# Patient Record
Sex: Male | Born: 1935 | Race: White | Hispanic: No | Marital: Married | Smoking: Former smoker
Health system: Southern US, Community
[De-identification: ages and names within clinical notes are randomized; demographics above are authoritative.]

## PROBLEM LIST (undated history)

## (undated) DIAGNOSIS — N400 Enlarged prostate without lower urinary tract symptoms: Secondary | ICD-10-CM

## (undated) DIAGNOSIS — I219 Acute myocardial infarction, unspecified: Secondary | ICD-10-CM

## (undated) DIAGNOSIS — C439 Malignant melanoma of skin, unspecified: Secondary | ICD-10-CM

## (undated) DIAGNOSIS — R7881 Bacteremia: Secondary | ICD-10-CM

## (undated) DIAGNOSIS — J189 Pneumonia, unspecified organism: Secondary | ICD-10-CM

## (undated) DIAGNOSIS — M5030 Other cervical disc degeneration, unspecified cervical region: Secondary | ICD-10-CM

## (undated) DIAGNOSIS — E559 Vitamin D deficiency, unspecified: Secondary | ICD-10-CM

## (undated) DIAGNOSIS — E538 Deficiency of other specified B group vitamins: Secondary | ICD-10-CM

## (undated) DIAGNOSIS — I2510 Atherosclerotic heart disease of native coronary artery without angina pectoris: Secondary | ICD-10-CM

## (undated) DIAGNOSIS — I10 Essential (primary) hypertension: Secondary | ICD-10-CM

## (undated) DIAGNOSIS — G4700 Insomnia, unspecified: Secondary | ICD-10-CM

## (undated) DIAGNOSIS — E119 Type 2 diabetes mellitus without complications: Secondary | ICD-10-CM

## (undated) HISTORY — PX: HEMORRHOID SURGERY: SHX153

## (undated) HISTORY — DX: Deficiency of other specified B group vitamins: E53.8

## (undated) HISTORY — PX: HERNIA REPAIR: SHX51

## (undated) HISTORY — DX: Vitamin D deficiency, unspecified: E55.9

## (undated) HISTORY — PX: ROTATOR CUFF REPAIR: SHX139

## (undated) HISTORY — PX: OTHER SURGICAL HISTORY: SHX169

## (undated) HISTORY — DX: Acute myocardial infarction, unspecified: I21.9

## (undated) HISTORY — DX: Benign prostatic hyperplasia without lower urinary tract symptoms: N40.0

## (undated) HISTORY — DX: Type 2 diabetes mellitus without complications: E11.9

## (undated) HISTORY — DX: Other cervical disc degeneration, unspecified cervical region: M50.30

## (undated) HISTORY — DX: Atherosclerotic heart disease of native coronary artery without angina pectoris: I25.10

## (undated) HISTORY — DX: Insomnia, unspecified: G47.00

---

## 1999-02-08 ENCOUNTER — Ambulatory Visit (HOSPITAL_COMMUNITY): Admission: RE | Admit: 1999-02-08 | Discharge: 1999-02-08 | Payer: Self-pay | Admitting: *Deleted

## 1999-02-08 ENCOUNTER — Encounter: Payer: Self-pay | Admitting: *Deleted

## 2001-03-29 ENCOUNTER — Emergency Department (HOSPITAL_COMMUNITY): Admission: EM | Admit: 2001-03-29 | Discharge: 2001-03-29 | Payer: Self-pay | Admitting: Emergency Medicine

## 2001-03-29 ENCOUNTER — Encounter: Payer: Self-pay | Admitting: Emergency Medicine

## 2011-04-22 DIAGNOSIS — L570 Actinic keratosis: Secondary | ICD-10-CM | POA: Diagnosis not present

## 2011-04-22 DIAGNOSIS — D492 Neoplasm of unspecified behavior of bone, soft tissue, and skin: Secondary | ICD-10-CM | POA: Diagnosis not present

## 2011-04-22 DIAGNOSIS — Z85828 Personal history of other malignant neoplasm of skin: Secondary | ICD-10-CM | POA: Diagnosis not present

## 2011-04-22 DIAGNOSIS — Z85820 Personal history of malignant melanoma of skin: Secondary | ICD-10-CM | POA: Diagnosis not present

## 2011-04-22 DIAGNOSIS — B372 Candidiasis of skin and nail: Secondary | ICD-10-CM | POA: Diagnosis not present

## 2011-06-02 DIAGNOSIS — C44621 Squamous cell carcinoma of skin of unspecified upper limb, including shoulder: Secondary | ICD-10-CM | POA: Diagnosis not present

## 2011-06-02 DIAGNOSIS — L570 Actinic keratosis: Secondary | ICD-10-CM | POA: Diagnosis not present

## 2011-07-03 DIAGNOSIS — E119 Type 2 diabetes mellitus without complications: Secondary | ICD-10-CM | POA: Diagnosis not present

## 2011-07-03 DIAGNOSIS — G4700 Insomnia, unspecified: Secondary | ICD-10-CM | POA: Diagnosis not present

## 2011-08-01 DIAGNOSIS — H35039 Hypertensive retinopathy, unspecified eye: Secondary | ICD-10-CM | POA: Diagnosis not present

## 2011-09-26 DIAGNOSIS — E119 Type 2 diabetes mellitus without complications: Secondary | ICD-10-CM | POA: Diagnosis not present

## 2011-09-26 DIAGNOSIS — IMO0002 Reserved for concepts with insufficient information to code with codable children: Secondary | ICD-10-CM | POA: Diagnosis not present

## 2011-09-26 DIAGNOSIS — X58XXXA Exposure to other specified factors, initial encounter: Secondary | ICD-10-CM | POA: Diagnosis not present

## 2011-09-26 DIAGNOSIS — K8020 Calculus of gallbladder without cholecystitis without obstruction: Secondary | ICD-10-CM | POA: Diagnosis not present

## 2011-09-26 DIAGNOSIS — N281 Cyst of kidney, acquired: Secondary | ICD-10-CM | POA: Diagnosis not present

## 2011-09-26 DIAGNOSIS — M25559 Pain in unspecified hip: Secondary | ICD-10-CM | POA: Diagnosis not present

## 2011-09-26 DIAGNOSIS — I10 Essential (primary) hypertension: Secondary | ICD-10-CM | POA: Diagnosis not present

## 2011-09-26 DIAGNOSIS — Q619 Cystic kidney disease, unspecified: Secondary | ICD-10-CM | POA: Diagnosis not present

## 2011-09-26 DIAGNOSIS — R109 Unspecified abdominal pain: Secondary | ICD-10-CM | POA: Diagnosis not present

## 2011-09-26 DIAGNOSIS — R918 Other nonspecific abnormal finding of lung field: Secondary | ICD-10-CM | POA: Diagnosis not present

## 2011-10-02 DIAGNOSIS — M1990 Unspecified osteoarthritis, unspecified site: Secondary | ICD-10-CM | POA: Diagnosis not present

## 2011-10-02 DIAGNOSIS — M25559 Pain in unspecified hip: Secondary | ICD-10-CM | POA: Diagnosis not present

## 2011-10-03 DIAGNOSIS — K5730 Diverticulosis of large intestine without perforation or abscess without bleeding: Secondary | ICD-10-CM | POA: Diagnosis not present

## 2011-10-03 DIAGNOSIS — Z043 Encounter for examination and observation following other accident: Secondary | ICD-10-CM | POA: Diagnosis not present

## 2011-10-03 DIAGNOSIS — S72009A Fracture of unspecified part of neck of unspecified femur, initial encounter for closed fracture: Secondary | ICD-10-CM | POA: Diagnosis not present

## 2011-10-03 DIAGNOSIS — M47817 Spondylosis without myelopathy or radiculopathy, lumbosacral region: Secondary | ICD-10-CM | POA: Diagnosis not present

## 2011-10-07 DIAGNOSIS — S7223XA Displaced subtrochanteric fracture of unspecified femur, initial encounter for closed fracture: Secondary | ICD-10-CM | POA: Diagnosis not present

## 2011-10-08 DIAGNOSIS — E11319 Type 2 diabetes mellitus with unspecified diabetic retinopathy without macular edema: Secondary | ICD-10-CM | POA: Diagnosis not present

## 2011-10-09 DIAGNOSIS — S7290XA Unspecified fracture of unspecified femur, initial encounter for closed fracture: Secondary | ICD-10-CM | POA: Diagnosis not present

## 2011-10-21 DIAGNOSIS — L570 Actinic keratosis: Secondary | ICD-10-CM | POA: Diagnosis not present

## 2011-10-21 DIAGNOSIS — Z85820 Personal history of malignant melanoma of skin: Secondary | ICD-10-CM | POA: Diagnosis not present

## 2011-10-21 DIAGNOSIS — Z85828 Personal history of other malignant neoplasm of skin: Secondary | ICD-10-CM | POA: Diagnosis not present

## 2011-10-24 DIAGNOSIS — S7290XA Unspecified fracture of unspecified femur, initial encounter for closed fracture: Secondary | ICD-10-CM | POA: Diagnosis not present

## 2011-12-05 DIAGNOSIS — I10 Essential (primary) hypertension: Secondary | ICD-10-CM | POA: Diagnosis not present

## 2011-12-05 DIAGNOSIS — G609 Hereditary and idiopathic neuropathy, unspecified: Secondary | ICD-10-CM | POA: Diagnosis not present

## 2011-12-05 DIAGNOSIS — E119 Type 2 diabetes mellitus without complications: Secondary | ICD-10-CM | POA: Diagnosis not present

## 2011-12-05 DIAGNOSIS — E538 Deficiency of other specified B group vitamins: Secondary | ICD-10-CM | POA: Diagnosis not present

## 2011-12-05 DIAGNOSIS — E785 Hyperlipidemia, unspecified: Secondary | ICD-10-CM | POA: Diagnosis not present

## 2011-12-17 DIAGNOSIS — N138 Other obstructive and reflux uropathy: Secondary | ICD-10-CM | POA: Diagnosis not present

## 2011-12-17 DIAGNOSIS — Z0000 Encounter for general adult medical examination without abnormal findings: Secondary | ICD-10-CM | POA: Diagnosis not present

## 2011-12-17 DIAGNOSIS — Z125 Encounter for screening for malignant neoplasm of prostate: Secondary | ICD-10-CM | POA: Diagnosis not present

## 2011-12-31 DIAGNOSIS — K625 Hemorrhage of anus and rectum: Secondary | ICD-10-CM | POA: Diagnosis not present

## 2012-01-14 DIAGNOSIS — K625 Hemorrhage of anus and rectum: Secondary | ICD-10-CM | POA: Diagnosis not present

## 2012-01-14 DIAGNOSIS — K5730 Diverticulosis of large intestine without perforation or abscess without bleeding: Secondary | ICD-10-CM | POA: Diagnosis not present

## 2012-01-14 DIAGNOSIS — K649 Unspecified hemorrhoids: Secondary | ICD-10-CM | POA: Diagnosis not present

## 2012-01-14 DIAGNOSIS — Z1211 Encounter for screening for malignant neoplasm of colon: Secondary | ICD-10-CM | POA: Diagnosis not present

## 2012-01-16 DIAGNOSIS — K648 Other hemorrhoids: Secondary | ICD-10-CM | POA: Diagnosis not present

## 2012-01-16 DIAGNOSIS — Z1211 Encounter for screening for malignant neoplasm of colon: Secondary | ICD-10-CM | POA: Diagnosis not present

## 2012-01-16 DIAGNOSIS — K5730 Diverticulosis of large intestine without perforation or abscess without bleeding: Secondary | ICD-10-CM | POA: Diagnosis not present

## 2012-01-16 DIAGNOSIS — Z800 Family history of malignant neoplasm of digestive organs: Secondary | ICD-10-CM | POA: Diagnosis not present

## 2012-01-23 DIAGNOSIS — Z23 Encounter for immunization: Secondary | ICD-10-CM | POA: Diagnosis not present

## 2012-03-29 DIAGNOSIS — D235 Other benign neoplasm of skin of trunk: Secondary | ICD-10-CM | POA: Diagnosis not present

## 2012-03-29 DIAGNOSIS — Z85828 Personal history of other malignant neoplasm of skin: Secondary | ICD-10-CM | POA: Diagnosis not present

## 2012-03-29 DIAGNOSIS — Z85820 Personal history of malignant melanoma of skin: Secondary | ICD-10-CM | POA: Diagnosis not present

## 2012-03-29 DIAGNOSIS — L570 Actinic keratosis: Secondary | ICD-10-CM | POA: Diagnosis not present

## 2012-03-29 DIAGNOSIS — L820 Inflamed seborrheic keratosis: Secondary | ICD-10-CM | POA: Diagnosis not present

## 2012-05-12 ENCOUNTER — Emergency Department (HOSPITAL_COMMUNITY)
Admission: EM | Admit: 2012-05-12 | Discharge: 2012-05-12 | Disposition: A | Discharge status: Home / Self Care | Payer: Medicare Other | Attending: Emergency Medicine | Admitting: Emergency Medicine

## 2012-05-12 ENCOUNTER — Encounter (HOSPITAL_COMMUNITY): Payer: Self-pay | Admitting: *Deleted

## 2012-05-12 ENCOUNTER — Emergency Department (HOSPITAL_COMMUNITY): Payer: Medicare Other

## 2012-05-12 DIAGNOSIS — R059 Cough, unspecified: Secondary | ICD-10-CM | POA: Diagnosis not present

## 2012-05-12 DIAGNOSIS — M79603 Pain in arm, unspecified: Secondary | ICD-10-CM

## 2012-05-12 DIAGNOSIS — Z85820 Personal history of malignant melanoma of skin: Secondary | ICD-10-CM | POA: Insufficient documentation

## 2012-05-12 DIAGNOSIS — Z87891 Personal history of nicotine dependence: Secondary | ICD-10-CM | POA: Insufficient documentation

## 2012-05-12 DIAGNOSIS — Z79899 Other long term (current) drug therapy: Secondary | ICD-10-CM | POA: Insufficient documentation

## 2012-05-12 DIAGNOSIS — I10 Essential (primary) hypertension: Secondary | ICD-10-CM | POA: Diagnosis not present

## 2012-05-12 DIAGNOSIS — Z8701 Personal history of pneumonia (recurrent): Secondary | ICD-10-CM | POA: Insufficient documentation

## 2012-05-12 DIAGNOSIS — J438 Other emphysema: Secondary | ICD-10-CM | POA: Diagnosis not present

## 2012-05-12 DIAGNOSIS — R42 Dizziness and giddiness: Secondary | ICD-10-CM | POA: Insufficient documentation

## 2012-05-12 DIAGNOSIS — R05 Cough: Secondary | ICD-10-CM | POA: Insufficient documentation

## 2012-05-12 DIAGNOSIS — M79609 Pain in unspecified limb: Secondary | ICD-10-CM | POA: Insufficient documentation

## 2012-05-12 HISTORY — DX: Malignant melanoma of skin, unspecified: C43.9

## 2012-05-12 HISTORY — DX: Essential (primary) hypertension: I10

## 2012-05-12 HISTORY — DX: Pneumonia, unspecified organism: J18.9

## 2012-05-12 LAB — CBC WITH DIFFERENTIAL/PLATELET
Basophils Absolute: 0 10*3/uL (ref 0.0–0.1)
Basophils Relative: 1 % (ref 0–1)
Eosinophils Absolute: 0.1 10*3/uL (ref 0.0–0.7)
Eosinophils Relative: 2 % (ref 0–5)
HCT: 39.6 % (ref 39.0–52.0)
Hemoglobin: 13.5 g/dL (ref 13.0–17.0)
Lymphocytes Relative: 37 % (ref 12–46)
Lymphs Abs: 2.6 10*3/uL (ref 0.7–4.0)
MCH: 30.6 pg (ref 26.0–34.0)
MCHC: 34.1 g/dL (ref 30.0–36.0)
MCV: 89.8 fL (ref 78.0–100.0)
Monocytes Absolute: 0.7 10*3/uL (ref 0.1–1.0)
Monocytes Relative: 9 % (ref 3–12)
Neutro Abs: 3.6 10*3/uL (ref 1.7–7.7)
Neutrophils Relative %: 51 % (ref 43–77)
Platelets: 169 10*3/uL (ref 150–400)
RBC: 4.41 MIL/uL (ref 4.22–5.81)
RDW: 13.7 % (ref 11.5–15.5)
WBC: 7.1 10*3/uL (ref 4.0–10.5)

## 2012-05-12 LAB — POCT I-STAT TROPONIN I
Troponin i, poc: 0 ng/mL (ref 0.00–0.08)
Troponin i, poc: 0 ng/mL (ref 0.00–0.08)

## 2012-05-12 LAB — POCT I-STAT, CHEM 8
BUN: 20 mg/dL (ref 6–23)
Calcium, Ion: 1.18 mmol/L (ref 1.13–1.30)
Chloride: 104 mEq/L (ref 96–112)
Creatinine, Ser: 0.8 mg/dL (ref 0.50–1.35)
Glucose, Bld: 178 mg/dL — ABNORMAL HIGH (ref 70–99)
HCT: 41 % (ref 39.0–52.0)
Hemoglobin: 13.9 g/dL (ref 13.0–17.0)
Potassium: 4.7 mEq/L (ref 3.5–5.1)
Sodium: 140 mEq/L (ref 135–145)
TCO2: 29 mmol/L (ref 0–100)

## 2012-05-12 NOTE — ED Provider Notes (Signed)
History     CSN: [id number]  Arrival date & time 05/12/12  1351   First MD Initiated Contact with Patient 05/12/12 1426      Chief Complaint  Patient presents with  . Arm Pain    (Consider location/radiation/quality/duration/timing/severity/associated sxs/prior treatment) The history is provided by the patient.   patient presents with a left-sided arm pain. It began while he was eating. It was on the back the arm and was sharp. he is pain-free now. He states he stood up and was a little lightheaded. No chest pain. No fevers. No new cough. He had no nausea. No diaphoresis. He has no known heart issues. He states he has not seen a cardiologist. No weight loss. No hemoptysis. No swelling in his legs.  Past Medical History  Diagnosis Date  . Pneumonia   . Melanoma   . Hypertension     Past Surgical History  Procedure Laterality Date  . Hernia repair      L inguinal  . Rotator cuff repair      R  . Melanoma removal      History reviewed. No pertinent family history.  History  Substance Use Topics  . Smoking status: Former Games developer  . Smokeless tobacco: Not on file  . Alcohol Use: Not on file      Review of Systems  Constitutional: Negative for activity change and appetite change.  HENT: Negative for neck stiffness.   Eyes: Negative for pain.  Respiratory: Positive for cough. Negative for chest tightness and shortness of breath.   Cardiovascular: Negative for chest pain and leg swelling.  Gastrointestinal: Negative for nausea, vomiting, abdominal pain and diarrhea.  Genitourinary: Negative for flank pain.  Musculoskeletal: Negative for back pain.  Skin: Negative for rash.  Neurological: Negative for weakness, numbness and headaches.  Psychiatric/Behavioral: Negative for behavioral problems.    Allergies  Demerol  Home Medications   Current Outpatient Rx  Name  Route  Sig  Dispense  Refill  . cholecalciferol (VITAMIN D) 1000 UNITS tablet   Oral   Take 1,000  Units by mouth daily.         Marland Kitchen losartan (COZAAR) 50 MG tablet   Oral   Take 50 mg by mouth daily.         . Polyvinyl Alcohol-Povidone (REFRESH OP)   Both Eyes   Place 1 drop into both eyes daily.         . Saw Palmetto, Serenoa repens, (SAW PALMETTO PO)   Oral   Take 1 tablet by mouth daily.         . traZODone (DESYREL) 50 MG tablet   Oral   Take 50 mg by mouth at bedtime.           BP 167/90  Pulse 69  Temp(Src) 97.6 F (36.4 C) (Oral)  Resp 15  Ht 5' 11.25" (1.81 m)  Wt 170 lb (77.111 kg)  BMI 23.54 kg/m2  SpO2 98%  Physical Exam  Nursing note and vitals reviewed. Constitutional: He is oriented to person, place, and time. He appears well-developed and well-nourished.  HENT:  Head: Normocephalic and atraumatic.  Eyes: EOM are normal. Pupils are equal, round, and reactive to light.  Neck: Normal range of motion. Neck supple.  Cardiovascular: Normal rate, regular rhythm and normal heart sounds.   No murmur heard. Pulmonary/Chest: Effort normal and breath sounds normal.  Abdominal: Soft. Bowel sounds are normal. He exhibits no distension and no mass. There is no tenderness. There  is no rebound and no guarding.  Musculoskeletal: Normal range of motion. He exhibits no edema.  Neurological: He is alert and oriented to person, place, and time. No cranial nerve deficit.  Skin: Skin is warm and dry.  Psychiatric: He has a normal mood and affect.    ED Course  Procedures (including critical care time)  Labs Reviewed  POCT I-STAT, CHEM 8 - Abnormal; Notable for the following:    Glucose, Bld 178 (*)    All other components within normal limits  CBC WITH DIFFERENTIAL  POCT I-STAT TROPONIN I  POCT I-STAT TROPONIN I   Dg Chest 2 View  05/12/2012  *RADIOLOGY REPORT*  Clinical Data: Arm pain  CHEST - 2 VIEW  Comparison: Chest x-ray of 09/06/2010  Findings: The lungs are clear but hyperaerated consistent with emphysema.  No infiltrate or effusion is seen.   Mediastinal contours are stable.  The heart is within normal limits in size. There are degenerative changes diffusely throughout the thoracic spine.  IMPRESSION: Emphysema.  No active lung disease.   Original Report Authenticated By: Dwyane Dee, M.D.      1. Arm pain      Date: 05/12/2012  Rate: 84  Rhythm: normal sinus rhythm  QRS Axis: normal  Intervals: normal  ST/T Wave abnormalities: normal  Conduction Disutrbances:none  Narrative Interpretation:   Old EKG Reviewed: none available    MDM    patient was left arm pain. EKG and labwork reassuring. Doubt cardiac cause. Negative troponin x2.       Juliet Rude. Rubin Payor, MD 05/12/12 1752

## 2012-05-12 NOTE — ED Notes (Signed)
Pt was eating lunch when he began experiencing L shoulder pain that radiated down to his elbow accompanied by nausea.  Pt also noticed dizziness when he stood up. Denies sob.  Pt denies cardiac hx.

## 2012-05-28 DIAGNOSIS — E538 Deficiency of other specified B group vitamins: Secondary | ICD-10-CM | POA: Diagnosis not present

## 2012-05-28 DIAGNOSIS — Z79899 Other long term (current) drug therapy: Secondary | ICD-10-CM | POA: Diagnosis not present

## 2012-05-28 DIAGNOSIS — E119 Type 2 diabetes mellitus without complications: Secondary | ICD-10-CM | POA: Diagnosis not present

## 2012-06-04 DIAGNOSIS — E119 Type 2 diabetes mellitus without complications: Secondary | ICD-10-CM | POA: Diagnosis not present

## 2012-06-15 DIAGNOSIS — R42 Dizziness and giddiness: Secondary | ICD-10-CM | POA: Diagnosis not present

## 2012-06-24 DIAGNOSIS — R42 Dizziness and giddiness: Secondary | ICD-10-CM | POA: Diagnosis not present

## 2012-07-20 DIAGNOSIS — E538 Deficiency of other specified B group vitamins: Secondary | ICD-10-CM | POA: Diagnosis not present

## 2012-07-20 DIAGNOSIS — I10 Essential (primary) hypertension: Secondary | ICD-10-CM | POA: Diagnosis not present

## 2012-07-20 DIAGNOSIS — E119 Type 2 diabetes mellitus without complications: Secondary | ICD-10-CM | POA: Diagnosis not present

## 2012-07-20 DIAGNOSIS — N400 Enlarged prostate without lower urinary tract symptoms: Secondary | ICD-10-CM | POA: Diagnosis not present

## 2012-07-20 DIAGNOSIS — Z0000 Encounter for general adult medical examination without abnormal findings: Secondary | ICD-10-CM | POA: Diagnosis not present

## 2012-07-20 DIAGNOSIS — G479 Sleep disorder, unspecified: Secondary | ICD-10-CM | POA: Diagnosis not present

## 2012-07-20 DIAGNOSIS — E559 Vitamin D deficiency, unspecified: Secondary | ICD-10-CM | POA: Diagnosis not present

## 2012-07-20 DIAGNOSIS — M2550 Pain in unspecified joint: Secondary | ICD-10-CM | POA: Diagnosis not present

## 2012-07-20 DIAGNOSIS — M5090 Cervical disc disorder, unspecified, unspecified cervical region: Secondary | ICD-10-CM | POA: Diagnosis not present

## 2012-07-20 DIAGNOSIS — Z79899 Other long term (current) drug therapy: Secondary | ICD-10-CM | POA: Diagnosis not present

## 2012-08-09 DIAGNOSIS — R3915 Urgency of urination: Secondary | ICD-10-CM | POA: Diagnosis not present

## 2012-08-09 DIAGNOSIS — N400 Enlarged prostate without lower urinary tract symptoms: Secondary | ICD-10-CM | POA: Diagnosis not present

## 2012-08-31 DIAGNOSIS — M5090 Cervical disc disorder, unspecified, unspecified cervical region: Secondary | ICD-10-CM | POA: Diagnosis not present

## 2012-08-31 DIAGNOSIS — I10 Essential (primary) hypertension: Secondary | ICD-10-CM | POA: Diagnosis not present

## 2012-08-31 DIAGNOSIS — E559 Vitamin D deficiency, unspecified: Secondary | ICD-10-CM | POA: Diagnosis not present

## 2012-08-31 DIAGNOSIS — N400 Enlarged prostate without lower urinary tract symptoms: Secondary | ICD-10-CM | POA: Diagnosis not present

## 2012-08-31 DIAGNOSIS — E119 Type 2 diabetes mellitus without complications: Secondary | ICD-10-CM | POA: Diagnosis not present

## 2012-08-31 DIAGNOSIS — E538 Deficiency of other specified B group vitamins: Secondary | ICD-10-CM | POA: Diagnosis not present

## 2012-08-31 DIAGNOSIS — M2550 Pain in unspecified joint: Secondary | ICD-10-CM | POA: Diagnosis not present

## 2012-08-31 DIAGNOSIS — G479 Sleep disorder, unspecified: Secondary | ICD-10-CM | POA: Diagnosis not present

## 2012-10-06 DIAGNOSIS — E119 Type 2 diabetes mellitus without complications: Secondary | ICD-10-CM | POA: Diagnosis not present

## 2012-10-29 ENCOUNTER — Emergency Department (HOSPITAL_COMMUNITY): Payer: Medicare Other

## 2012-10-29 ENCOUNTER — Emergency Department (HOSPITAL_COMMUNITY)
Admission: EM | Admit: 2012-10-29 | Discharge: 2012-10-29 | Disposition: A | Discharge status: Home / Self Care | Payer: Medicare Other | Attending: Emergency Medicine | Admitting: Emergency Medicine

## 2012-10-29 ENCOUNTER — Encounter (HOSPITAL_COMMUNITY): Payer: Self-pay

## 2012-10-29 DIAGNOSIS — Z79899 Other long term (current) drug therapy: Secondary | ICD-10-CM | POA: Insufficient documentation

## 2012-10-29 DIAGNOSIS — M542 Cervicalgia: Secondary | ICD-10-CM | POA: Diagnosis not present

## 2012-10-29 DIAGNOSIS — Z8701 Personal history of pneumonia (recurrent): Secondary | ICD-10-CM | POA: Diagnosis not present

## 2012-10-29 DIAGNOSIS — I10 Essential (primary) hypertension: Secondary | ICD-10-CM | POA: Insufficient documentation

## 2012-10-29 DIAGNOSIS — Y92009 Unspecified place in unspecified non-institutional (private) residence as the place of occurrence of the external cause: Secondary | ICD-10-CM | POA: Insufficient documentation

## 2012-10-29 DIAGNOSIS — Z85828 Personal history of other malignant neoplasm of skin: Secondary | ICD-10-CM | POA: Insufficient documentation

## 2012-10-29 DIAGNOSIS — S0093XA Contusion of unspecified part of head, initial encounter: Secondary | ICD-10-CM

## 2012-10-29 DIAGNOSIS — W19XXXA Unspecified fall, initial encounter: Secondary | ICD-10-CM

## 2012-10-29 DIAGNOSIS — S0993XA Unspecified injury of face, initial encounter: Secondary | ICD-10-CM | POA: Diagnosis not present

## 2012-10-29 DIAGNOSIS — Z87891 Personal history of nicotine dependence: Secondary | ICD-10-CM | POA: Insufficient documentation

## 2012-10-29 DIAGNOSIS — S0003XA Contusion of scalp, initial encounter: Secondary | ICD-10-CM | POA: Insufficient documentation

## 2012-10-29 DIAGNOSIS — W010XXA Fall on same level from slipping, tripping and stumbling without subsequent striking against object, initial encounter: Secondary | ICD-10-CM | POA: Insufficient documentation

## 2012-10-29 DIAGNOSIS — Y9389 Activity, other specified: Secondary | ICD-10-CM | POA: Insufficient documentation

## 2012-10-29 DIAGNOSIS — S1093XA Contusion of unspecified part of neck, initial encounter: Secondary | ICD-10-CM | POA: Diagnosis not present

## 2012-10-29 NOTE — ED Provider Notes (Signed)
CSN: [id number]     Arrival date & time 10/29/12  1132 History     First MD Initiated Contact with Patient 10/29/12 1141     Chief Complaint  Patient presents with  . Fall   (Consider location/radiation/quality/duration/timing/severity/associated sxs/prior Treatment) HPI  Bronislaw Switzer is a 79 y.o. male with past medical history significant for hypertension, melanoma, not on any blood thinners coming in complaining of mechanical trip and fall when he stumbled over a step this a.m. Patient has full recollection of all events, he denies loss of consciousness, headache, cervicalgia, chest pain, abdominal pain, difficulty moving major joints, dysarthria ataxia, change in vision or unilateral weakness. He has a hematoma to the right forehead, but he states it is painless.  Past Medical History  Diagnosis Date  . Pneumonia   . Melanoma   . Hypertension    Past Surgical History  Procedure Laterality Date  . Hernia repair      L inguinal  . Rotator cuff repair      R  . Melanoma removal     No family history on file. History  Substance Use Topics  . Smoking status: Former Games developer  . Smokeless tobacco: Not on file  . Alcohol Use: Not on file    Review of Systems 10 systems reviewed and found to be negative, except as noted in the HPI  Allergies  Demerol  Home Medications   Current Outpatient Rx  Name  Route  Sig  Dispense  Refill  . cholecalciferol (VITAMIN D) 1000 UNITS tablet   Oral   Take 1,000 Units by mouth daily.         Marland Kitchen losartan (COZAAR) 50 MG tablet   Oral   Take 50 mg by mouth daily.         . Polyvinyl Alcohol-Povidone (REFRESH OP)   Both Eyes   Place 1 drop into both eyes daily.         . Saw Palmetto, Serenoa repens, (SAW PALMETTO PO)   Oral   Take 1 tablet by mouth daily.         . traZODone (DESYREL) 50 MG tablet   Oral   Take 50 mg by mouth at bedtime.          BP 139/79  Pulse 87  Temp(Src) 98 F (36.7 C) (Oral)  Resp 18  SpO2  99% Physical Exam  Nursing note and vitals reviewed. Constitutional: He is oriented to person, place, and time. He appears well-developed and well-nourished. No distress.  HENT:  Head: Normocephalic.  Mouth/Throat: Oropharynx is clear and moist.  Large hematoma to right  forehead  Eyes: Conjunctivae and EOM are normal. Pupils are equal, round, and reactive to light.  Neck: Normal range of motion. Neck supple.  No midline tenderness to palpation or step-offs appreciated. Patient has full range of motion without pain.    Cardiovascular: Normal rate.   Pulmonary/Chest: Effort normal and breath sounds normal. No stridor. No respiratory distress. He has no wheezes. He has no rales. He exhibits no tenderness.  Abdominal: Soft. Bowel sounds are normal. He exhibits no distension and no mass. There is no tenderness. There is no rebound.  Musculoskeletal: Normal range of motion.  Neurological: He is alert and oriented to person, place, and time.  Psychiatric: He has a normal mood and affect.    ED Course   Procedures (including critical care time)  Labs Reviewed - No data to display Dg Cervical Spine Complete  10/29/2012   *  RADIOLOGY REPORT*  Clinical Data: Larey Seat and hit head.  Neck pain.  CERVICAL SPINE - COMPLETE 4+ VIEW  Comparison: 07/20/2012 and 01/24/2011  Findings: No evidence for fracture.  Marked loss of intervertebral disc height is seen at C4-5, C5-6, and C6-7 with endplate degeneration associated.  There is some more modest loss of intervertebral disc height at C3-4.  Stable appearance of the C1-2 articulation.  Facets are well-aligned bilaterally with facet osteoarthritis noted bilaterally in the mid and lower cervical levels.  Reversal of normal cervical lordosis is stable.  No prevertebral soft tissue swelling.  IMPRESSION: Stable exam.  Diffuse degenerative changes as before without new or acute interval findings.   Original Report Authenticated By: Kennith Center, M.D.   Ct Head Wo  Contrast  10/29/2012   *RADIOLOGY REPORT*  Clinical Data: Recent traumatic injury with right-sided hematoma  CT HEAD WITHOUT CONTRAST  Technique:  Contiguous axial images were obtained from the base of the skull through the vertex without contrast.  Comparison: None.  Findings: Small scalp hematoma is noted in the right frontal region.  No associated bony abnormality is seen.  The ventricles are mildly prominent as are the cortical sulci consistent with diffuse cortical atrophy.  No findings to suggest acute hemorrhage, acute infarction or space-occupying mass lesion are noted.  IMPRESSION: Soft tissue abnormality without acute intracranial abnormality.   Original Report Authenticated By: Alcide Clever, M.D.   1. Fall at home, initial encounter   2. Head contusion, initial encounter     MDM   Filed Vitals:   10/29/12 1138 10/29/12 1200 10/29/12 1221  BP: 139/79 123/64 146/78  Pulse: 87 79 87  Temp: 98 F (36.7 C)  97.8 F (36.6 C)  TempSrc: Oral  Oral  Resp: 18  20  SpO2: 99% 97% 98%     Eron Staat is a 79 y.o. male mechanical trip and fall and right forehead hematoma, normal neurologic exam, C-spine and head CT showed no abnormalities on imaging. We have discussed head trauma return precautions. Patient refused pain medication while in the ED.  Pt is hemodynamically stable, appropriate for, and amenable to discharge at this time. Pt verbalized understanding and agrees with care plan. All questions answered. Outpatient follow-up and specific return precautions discussed.    Note: Portions of this report may have been transcribed using voice recognition software. Every effort was made to ensure accuracy; however, inadvertent computerized transcription errors may be present    Wynetta Emery, PA-C 10/29/12 1357

## 2012-10-29 NOTE — ED Notes (Signed)
Patient states he has chronic neck problems, but not having any pain at this time.   Patient states he was coming inside from walking dog and he tripped going in.

## 2012-10-29 NOTE — ED Provider Notes (Signed)
Medical screening examination/treatment/procedure(s) were performed by non-physician practitioner and as supervising physician I was immediately available for consultation/collaboration.  Loraine Bhullar, MD 10/29/12 1414 

## 2012-10-29 NOTE — ED Notes (Signed)
Pt. Larey Seat this am when stepping up .  Pt. Has a hematoma to his rt. Forehead.  Pt. Remembers the fall,. Denies any pain or dizziness.

## 2012-12-07 DIAGNOSIS — E1142 Type 2 diabetes mellitus with diabetic polyneuropathy: Secondary | ICD-10-CM | POA: Diagnosis not present

## 2012-12-07 DIAGNOSIS — L84 Corns and callosities: Secondary | ICD-10-CM | POA: Diagnosis not present

## 2012-12-07 DIAGNOSIS — E1149 Type 2 diabetes mellitus with other diabetic neurological complication: Secondary | ICD-10-CM | POA: Diagnosis not present

## 2012-12-27 DIAGNOSIS — E119 Type 2 diabetes mellitus without complications: Secondary | ICD-10-CM | POA: Diagnosis not present

## 2012-12-27 DIAGNOSIS — E559 Vitamin D deficiency, unspecified: Secondary | ICD-10-CM | POA: Diagnosis not present

## 2012-12-27 DIAGNOSIS — E538 Deficiency of other specified B group vitamins: Secondary | ICD-10-CM | POA: Diagnosis not present

## 2012-12-27 DIAGNOSIS — G479 Sleep disorder, unspecified: Secondary | ICD-10-CM | POA: Diagnosis not present

## 2012-12-27 DIAGNOSIS — L84 Corns and callosities: Secondary | ICD-10-CM | POA: Diagnosis not present

## 2012-12-27 DIAGNOSIS — N400 Enlarged prostate without lower urinary tract symptoms: Secondary | ICD-10-CM | POA: Diagnosis not present

## 2012-12-27 DIAGNOSIS — I10 Essential (primary) hypertension: Secondary | ICD-10-CM | POA: Diagnosis not present

## 2012-12-27 DIAGNOSIS — M5090 Cervical disc disorder, unspecified, unspecified cervical region: Secondary | ICD-10-CM | POA: Diagnosis not present

## 2013-02-22 DIAGNOSIS — L84 Corns and callosities: Secondary | ICD-10-CM | POA: Diagnosis not present

## 2013-02-22 DIAGNOSIS — E538 Deficiency of other specified B group vitamins: Secondary | ICD-10-CM | POA: Diagnosis not present

## 2013-02-22 DIAGNOSIS — N400 Enlarged prostate without lower urinary tract symptoms: Secondary | ICD-10-CM | POA: Diagnosis not present

## 2013-02-22 DIAGNOSIS — Z23 Encounter for immunization: Secondary | ICD-10-CM | POA: Diagnosis not present

## 2013-02-22 DIAGNOSIS — E119 Type 2 diabetes mellitus without complications: Secondary | ICD-10-CM | POA: Diagnosis not present

## 2013-02-22 DIAGNOSIS — Z0000 Encounter for general adult medical examination without abnormal findings: Secondary | ICD-10-CM | POA: Diagnosis not present

## 2013-02-22 DIAGNOSIS — G479 Sleep disorder, unspecified: Secondary | ICD-10-CM | POA: Diagnosis not present

## 2013-02-22 DIAGNOSIS — E559 Vitamin D deficiency, unspecified: Secondary | ICD-10-CM | POA: Diagnosis not present

## 2013-02-22 DIAGNOSIS — I10 Essential (primary) hypertension: Secondary | ICD-10-CM | POA: Diagnosis not present

## 2013-02-22 DIAGNOSIS — I059 Rheumatic mitral valve disease, unspecified: Secondary | ICD-10-CM | POA: Diagnosis not present

## 2013-02-22 DIAGNOSIS — M2550 Pain in unspecified joint: Secondary | ICD-10-CM | POA: Diagnosis not present

## 2013-07-19 ENCOUNTER — Ambulatory Visit
Admission: RE | Admit: 2013-07-19 | Discharge: 2013-07-19 | Disposition: A | Discharge status: Home / Self Care | Payer: Medicare Other | Source: Ambulatory Visit | Attending: Internal Medicine | Admitting: Internal Medicine

## 2013-07-19 ENCOUNTER — Other Ambulatory Visit: Payer: Self-pay | Admitting: Internal Medicine

## 2013-07-19 DIAGNOSIS — R059 Cough, unspecified: Secondary | ICD-10-CM | POA: Diagnosis not present

## 2013-07-19 DIAGNOSIS — R0789 Other chest pain: Secondary | ICD-10-CM | POA: Diagnosis not present

## 2013-07-19 DIAGNOSIS — R000 Tachycardia, unspecified: Secondary | ICD-10-CM | POA: Diagnosis not present

## 2013-07-19 DIAGNOSIS — R05 Cough: Secondary | ICD-10-CM

## 2013-07-19 DIAGNOSIS — K3189 Other diseases of stomach and duodenum: Secondary | ICD-10-CM | POA: Diagnosis not present

## 2013-07-19 DIAGNOSIS — R1013 Epigastric pain: Secondary | ICD-10-CM | POA: Diagnosis not present

## 2013-07-19 DIAGNOSIS — R079 Chest pain, unspecified: Secondary | ICD-10-CM | POA: Diagnosis not present

## 2013-07-21 ENCOUNTER — Inpatient Hospital Stay (HOSPITAL_COMMUNITY): Admission: RE | Admit: 2013-07-21 | Payer: Medicare Other | Source: Ambulatory Visit

## 2013-08-02 DIAGNOSIS — K3189 Other diseases of stomach and duodenum: Secondary | ICD-10-CM | POA: Diagnosis not present

## 2013-08-02 DIAGNOSIS — R000 Tachycardia, unspecified: Secondary | ICD-10-CM | POA: Diagnosis not present

## 2013-08-02 DIAGNOSIS — R079 Chest pain, unspecified: Secondary | ICD-10-CM | POA: Diagnosis not present

## 2013-08-02 DIAGNOSIS — I059 Rheumatic mitral valve disease, unspecified: Secondary | ICD-10-CM | POA: Diagnosis not present

## 2013-08-02 DIAGNOSIS — R05 Cough: Secondary | ICD-10-CM | POA: Diagnosis not present

## 2013-08-02 DIAGNOSIS — R059 Cough, unspecified: Secondary | ICD-10-CM | POA: Diagnosis not present

## 2013-08-02 DIAGNOSIS — I10 Essential (primary) hypertension: Secondary | ICD-10-CM | POA: Diagnosis not present

## 2013-08-02 DIAGNOSIS — R1013 Epigastric pain: Secondary | ICD-10-CM | POA: Diagnosis not present

## 2013-08-05 ENCOUNTER — Telehealth (HOSPITAL_COMMUNITY): Payer: Self-pay

## 2013-08-11 ENCOUNTER — Ambulatory Visit (HOSPITAL_COMMUNITY)
Admission: RE | Admit: 2013-08-11 | Discharge: 2013-08-11 | Disposition: A | Discharge status: Home / Self Care | Payer: Medicare Other | Source: Ambulatory Visit | Attending: Cardiology | Admitting: Cardiology

## 2013-08-11 ENCOUNTER — Other Ambulatory Visit (HOSPITAL_COMMUNITY): Payer: Self-pay | Admitting: Internal Medicine

## 2013-08-11 DIAGNOSIS — R079 Chest pain, unspecified: Secondary | ICD-10-CM | POA: Insufficient documentation

## 2013-08-12 DIAGNOSIS — R3915 Urgency of urination: Secondary | ICD-10-CM | POA: Diagnosis not present

## 2013-09-01 ENCOUNTER — Encounter (HOSPITAL_COMMUNITY): Payer: Medicare Other

## 2013-09-04 DIAGNOSIS — I10 Essential (primary) hypertension: Secondary | ICD-10-CM | POA: Diagnosis not present

## 2013-09-04 DIAGNOSIS — I440 Atrioventricular block, first degree: Secondary | ICD-10-CM | POA: Diagnosis not present

## 2013-09-04 DIAGNOSIS — I2119 ST elevation (STEMI) myocardial infarction involving other coronary artery of inferior wall: Secondary | ICD-10-CM | POA: Diagnosis not present

## 2013-09-04 DIAGNOSIS — I219 Acute myocardial infarction, unspecified: Secondary | ICD-10-CM | POA: Diagnosis not present

## 2013-09-04 DIAGNOSIS — I4430 Unspecified atrioventricular block: Secondary | ICD-10-CM | POA: Diagnosis not present

## 2013-09-04 DIAGNOSIS — I517 Cardiomegaly: Secondary | ICD-10-CM | POA: Diagnosis not present

## 2013-09-04 DIAGNOSIS — I2510 Atherosclerotic heart disease of native coronary artery without angina pectoris: Secondary | ICD-10-CM | POA: Diagnosis present

## 2013-09-04 DIAGNOSIS — Z87891 Personal history of nicotine dependence: Secondary | ICD-10-CM | POA: Diagnosis not present

## 2013-09-04 DIAGNOSIS — I059 Rheumatic mitral valve disease, unspecified: Secondary | ICD-10-CM | POA: Diagnosis present

## 2013-09-04 DIAGNOSIS — R079 Chest pain, unspecified: Secondary | ICD-10-CM | POA: Diagnosis not present

## 2013-09-04 DIAGNOSIS — I519 Heart disease, unspecified: Secondary | ICD-10-CM | POA: Diagnosis not present

## 2013-09-14 DIAGNOSIS — I2510 Atherosclerotic heart disease of native coronary artery without angina pectoris: Secondary | ICD-10-CM | POA: Diagnosis not present

## 2013-09-22 NOTE — Telephone Encounter (Signed)
Encounter complete. 

## 2013-10-05 DIAGNOSIS — Z5189 Encounter for other specified aftercare: Secondary | ICD-10-CM | POA: Diagnosis not present

## 2013-10-05 DIAGNOSIS — I252 Old myocardial infarction: Secondary | ICD-10-CM | POA: Diagnosis not present

## 2013-10-12 DIAGNOSIS — I252 Old myocardial infarction: Secondary | ICD-10-CM | POA: Diagnosis not present

## 2013-10-12 DIAGNOSIS — Z5189 Encounter for other specified aftercare: Secondary | ICD-10-CM | POA: Diagnosis not present

## 2013-10-12 DIAGNOSIS — I219 Acute myocardial infarction, unspecified: Secondary | ICD-10-CM | POA: Diagnosis not present

## 2013-10-12 DIAGNOSIS — I2510 Atherosclerotic heart disease of native coronary artery without angina pectoris: Secondary | ICD-10-CM | POA: Diagnosis not present

## 2013-10-12 DIAGNOSIS — I10 Essential (primary) hypertension: Secondary | ICD-10-CM | POA: Diagnosis not present

## 2013-10-14 DIAGNOSIS — I252 Old myocardial infarction: Secondary | ICD-10-CM | POA: Diagnosis not present

## 2013-10-14 DIAGNOSIS — Z5189 Encounter for other specified aftercare: Secondary | ICD-10-CM | POA: Diagnosis not present

## 2013-10-19 DIAGNOSIS — Z5189 Encounter for other specified aftercare: Secondary | ICD-10-CM | POA: Diagnosis not present

## 2013-10-19 DIAGNOSIS — I252 Old myocardial infarction: Secondary | ICD-10-CM | POA: Diagnosis not present

## 2013-10-21 DIAGNOSIS — I252 Old myocardial infarction: Secondary | ICD-10-CM | POA: Diagnosis not present

## 2013-10-21 DIAGNOSIS — Z5189 Encounter for other specified aftercare: Secondary | ICD-10-CM | POA: Diagnosis not present

## 2013-10-26 DIAGNOSIS — Z5189 Encounter for other specified aftercare: Secondary | ICD-10-CM | POA: Diagnosis not present

## 2013-10-26 DIAGNOSIS — I252 Old myocardial infarction: Secondary | ICD-10-CM | POA: Diagnosis not present

## 2013-10-28 DIAGNOSIS — I252 Old myocardial infarction: Secondary | ICD-10-CM | POA: Diagnosis not present

## 2013-10-28 DIAGNOSIS — Z5189 Encounter for other specified aftercare: Secondary | ICD-10-CM | POA: Diagnosis not present

## 2013-11-02 DIAGNOSIS — Z5189 Encounter for other specified aftercare: Secondary | ICD-10-CM | POA: Diagnosis not present

## 2013-11-02 DIAGNOSIS — I252 Old myocardial infarction: Secondary | ICD-10-CM | POA: Diagnosis not present

## 2013-11-03 ENCOUNTER — Ambulatory Visit: Payer: Medicare Other | Admitting: Cardiology

## 2013-11-04 DIAGNOSIS — Z5189 Encounter for other specified aftercare: Secondary | ICD-10-CM | POA: Diagnosis not present

## 2013-11-04 DIAGNOSIS — I252 Old myocardial infarction: Secondary | ICD-10-CM | POA: Diagnosis not present

## 2013-11-09 DIAGNOSIS — I252 Old myocardial infarction: Secondary | ICD-10-CM | POA: Diagnosis not present

## 2013-11-09 DIAGNOSIS — Z5189 Encounter for other specified aftercare: Secondary | ICD-10-CM | POA: Diagnosis not present

## 2013-11-11 DIAGNOSIS — Z5189 Encounter for other specified aftercare: Secondary | ICD-10-CM | POA: Diagnosis not present

## 2013-11-11 DIAGNOSIS — I252 Old myocardial infarction: Secondary | ICD-10-CM | POA: Diagnosis not present

## 2013-11-16 DIAGNOSIS — I252 Old myocardial infarction: Secondary | ICD-10-CM | POA: Diagnosis not present

## 2013-11-16 DIAGNOSIS — Z5189 Encounter for other specified aftercare: Secondary | ICD-10-CM | POA: Diagnosis not present

## 2013-11-18 DIAGNOSIS — Z5189 Encounter for other specified aftercare: Secondary | ICD-10-CM | POA: Diagnosis not present

## 2013-11-18 DIAGNOSIS — I252 Old myocardial infarction: Secondary | ICD-10-CM | POA: Diagnosis not present

## 2013-11-23 DIAGNOSIS — Z5189 Encounter for other specified aftercare: Secondary | ICD-10-CM | POA: Diagnosis not present

## 2013-11-23 DIAGNOSIS — I252 Old myocardial infarction: Secondary | ICD-10-CM | POA: Diagnosis not present

## 2013-11-25 DIAGNOSIS — I252 Old myocardial infarction: Secondary | ICD-10-CM | POA: Diagnosis not present

## 2013-11-25 DIAGNOSIS — Z5189 Encounter for other specified aftercare: Secondary | ICD-10-CM | POA: Diagnosis not present

## 2013-11-26 DIAGNOSIS — K8020 Calculus of gallbladder without cholecystitis without obstruction: Secondary | ICD-10-CM | POA: Diagnosis not present

## 2013-11-26 DIAGNOSIS — Z87891 Personal history of nicotine dependence: Secondary | ICD-10-CM | POA: Diagnosis not present

## 2013-11-26 DIAGNOSIS — Z9861 Coronary angioplasty status: Secondary | ICD-10-CM | POA: Diagnosis not present

## 2013-11-26 DIAGNOSIS — Z885 Allergy status to narcotic agent status: Secondary | ICD-10-CM | POA: Diagnosis not present

## 2013-11-26 DIAGNOSIS — Z86718 Personal history of other venous thrombosis and embolism: Secondary | ICD-10-CM | POA: Diagnosis not present

## 2013-11-26 DIAGNOSIS — D72829 Elevated white blood cell count, unspecified: Secondary | ICD-10-CM | POA: Diagnosis not present

## 2013-11-26 DIAGNOSIS — R1013 Epigastric pain: Secondary | ICD-10-CM | POA: Diagnosis not present

## 2013-11-26 DIAGNOSIS — K8000 Calculus of gallbladder with acute cholecystitis without obstruction: Secondary | ICD-10-CM | POA: Diagnosis present

## 2013-11-26 DIAGNOSIS — R000 Tachycardia, unspecified: Secondary | ICD-10-CM | POA: Diagnosis not present

## 2013-11-26 DIAGNOSIS — I252 Old myocardial infarction: Secondary | ICD-10-CM | POA: Diagnosis not present

## 2013-11-26 DIAGNOSIS — I2510 Atherosclerotic heart disease of native coronary artery without angina pectoris: Secondary | ICD-10-CM | POA: Diagnosis present

## 2013-11-26 DIAGNOSIS — R509 Fever, unspecified: Secondary | ICD-10-CM | POA: Diagnosis not present

## 2013-11-26 DIAGNOSIS — I10 Essential (primary) hypertension: Secondary | ICD-10-CM | POA: Diagnosis not present

## 2013-11-26 DIAGNOSIS — A419 Sepsis, unspecified organism: Secondary | ICD-10-CM | POA: Diagnosis not present

## 2013-11-26 DIAGNOSIS — I440 Atrioventricular block, first degree: Secondary | ICD-10-CM | POA: Diagnosis not present

## 2013-11-26 DIAGNOSIS — R079 Chest pain, unspecified: Secondary | ICD-10-CM | POA: Diagnosis not present

## 2013-11-26 DIAGNOSIS — K810 Acute cholecystitis: Secondary | ICD-10-CM | POA: Diagnosis not present

## 2013-12-02 DIAGNOSIS — Z5189 Encounter for other specified aftercare: Secondary | ICD-10-CM | POA: Diagnosis not present

## 2013-12-02 DIAGNOSIS — I252 Old myocardial infarction: Secondary | ICD-10-CM | POA: Diagnosis not present

## 2013-12-07 DIAGNOSIS — Z5189 Encounter for other specified aftercare: Secondary | ICD-10-CM | POA: Diagnosis not present

## 2013-12-07 DIAGNOSIS — I252 Old myocardial infarction: Secondary | ICD-10-CM | POA: Diagnosis not present

## 2013-12-09 DIAGNOSIS — Z5189 Encounter for other specified aftercare: Secondary | ICD-10-CM | POA: Diagnosis not present

## 2013-12-09 DIAGNOSIS — I252 Old myocardial infarction: Secondary | ICD-10-CM | POA: Diagnosis not present

## 2013-12-14 DIAGNOSIS — I10 Essential (primary) hypertension: Secondary | ICD-10-CM | POA: Diagnosis not present

## 2013-12-14 DIAGNOSIS — I200 Unstable angina: Secondary | ICD-10-CM | POA: Diagnosis not present

## 2013-12-14 DIAGNOSIS — E785 Hyperlipidemia, unspecified: Secondary | ICD-10-CM | POA: Diagnosis not present

## 2013-12-14 DIAGNOSIS — I2510 Atherosclerotic heart disease of native coronary artery without angina pectoris: Secondary | ICD-10-CM | POA: Diagnosis not present

## 2013-12-16 DIAGNOSIS — I252 Old myocardial infarction: Secondary | ICD-10-CM | POA: Diagnosis not present

## 2013-12-16 DIAGNOSIS — Z5189 Encounter for other specified aftercare: Secondary | ICD-10-CM | POA: Diagnosis not present

## 2013-12-21 DIAGNOSIS — Z5189 Encounter for other specified aftercare: Secondary | ICD-10-CM | POA: Diagnosis not present

## 2013-12-21 DIAGNOSIS — I252 Old myocardial infarction: Secondary | ICD-10-CM | POA: Diagnosis not present

## 2013-12-23 DIAGNOSIS — I252 Old myocardial infarction: Secondary | ICD-10-CM | POA: Diagnosis not present

## 2013-12-23 DIAGNOSIS — I2510 Atherosclerotic heart disease of native coronary artery without angina pectoris: Secondary | ICD-10-CM | POA: Diagnosis not present

## 2013-12-28 DIAGNOSIS — I2510 Atherosclerotic heart disease of native coronary artery without angina pectoris: Secondary | ICD-10-CM | POA: Diagnosis not present

## 2013-12-28 DIAGNOSIS — I252 Old myocardial infarction: Secondary | ICD-10-CM | POA: Diagnosis not present

## 2013-12-30 DIAGNOSIS — K812 Acute cholecystitis with chronic cholecystitis: Secondary | ICD-10-CM | POA: Diagnosis not present

## 2013-12-30 DIAGNOSIS — R079 Chest pain, unspecified: Secondary | ICD-10-CM | POA: Diagnosis not present

## 2013-12-30 DIAGNOSIS — I349 Nonrheumatic mitral valve disorder, unspecified: Secondary | ICD-10-CM | POA: Diagnosis not present

## 2013-12-30 DIAGNOSIS — I2510 Atherosclerotic heart disease of native coronary artery without angina pectoris: Secondary | ICD-10-CM | POA: Diagnosis not present

## 2013-12-30 DIAGNOSIS — E785 Hyperlipidemia, unspecified: Secondary | ICD-10-CM | POA: Diagnosis not present

## 2013-12-30 DIAGNOSIS — I2584 Coronary atherosclerosis due to calcified coronary lesion: Secondary | ICD-10-CM | POA: Diagnosis not present

## 2013-12-30 DIAGNOSIS — I10 Essential (primary) hypertension: Secondary | ICD-10-CM | POA: Diagnosis not present

## 2013-12-30 DIAGNOSIS — I252 Old myocardial infarction: Secondary | ICD-10-CM | POA: Diagnosis not present

## 2013-12-30 DIAGNOSIS — I25119 Atherosclerotic heart disease of native coronary artery with unspecified angina pectoris: Secondary | ICD-10-CM | POA: Diagnosis not present

## 2014-01-06 DIAGNOSIS — I2510 Atherosclerotic heart disease of native coronary artery without angina pectoris: Secondary | ICD-10-CM | POA: Diagnosis not present

## 2014-01-06 DIAGNOSIS — I252 Old myocardial infarction: Secondary | ICD-10-CM | POA: Diagnosis not present

## 2014-01-11 DIAGNOSIS — I2510 Atherosclerotic heart disease of native coronary artery without angina pectoris: Secondary | ICD-10-CM | POA: Diagnosis not present

## 2014-01-11 DIAGNOSIS — I252 Old myocardial infarction: Secondary | ICD-10-CM | POA: Diagnosis not present

## 2014-01-13 DIAGNOSIS — I2510 Atherosclerotic heart disease of native coronary artery without angina pectoris: Secondary | ICD-10-CM | POA: Diagnosis not present

## 2014-01-13 DIAGNOSIS — I252 Old myocardial infarction: Secondary | ICD-10-CM | POA: Diagnosis not present

## 2014-01-18 DIAGNOSIS — I2510 Atherosclerotic heart disease of native coronary artery without angina pectoris: Secondary | ICD-10-CM | POA: Diagnosis not present

## 2014-01-18 DIAGNOSIS — I252 Old myocardial infarction: Secondary | ICD-10-CM | POA: Diagnosis not present

## 2014-01-20 DIAGNOSIS — I2111 ST elevation (STEMI) myocardial infarction involving right coronary artery: Secondary | ICD-10-CM | POA: Diagnosis not present

## 2014-01-25 DIAGNOSIS — I25119 Atherosclerotic heart disease of native coronary artery with unspecified angina pectoris: Secondary | ICD-10-CM | POA: Diagnosis not present

## 2014-01-25 DIAGNOSIS — I10 Essential (primary) hypertension: Secondary | ICD-10-CM | POA: Diagnosis not present

## 2014-01-25 DIAGNOSIS — E785 Hyperlipidemia, unspecified: Secondary | ICD-10-CM | POA: Diagnosis not present

## 2014-01-27 DIAGNOSIS — I2111 ST elevation (STEMI) myocardial infarction involving right coronary artery: Secondary | ICD-10-CM | POA: Diagnosis not present

## 2014-02-01 DIAGNOSIS — I2111 ST elevation (STEMI) myocardial infarction involving right coronary artery: Secondary | ICD-10-CM | POA: Diagnosis not present

## 2014-02-03 DIAGNOSIS — I2111 ST elevation (STEMI) myocardial infarction involving right coronary artery: Secondary | ICD-10-CM | POA: Diagnosis not present

## 2014-02-08 DIAGNOSIS — I2111 ST elevation (STEMI) myocardial infarction involving right coronary artery: Secondary | ICD-10-CM | POA: Diagnosis not present

## 2014-02-10 DIAGNOSIS — I2111 ST elevation (STEMI) myocardial infarction involving right coronary artery: Secondary | ICD-10-CM | POA: Diagnosis not present

## 2014-02-15 DIAGNOSIS — I2111 ST elevation (STEMI) myocardial infarction involving right coronary artery: Secondary | ICD-10-CM | POA: Diagnosis not present

## 2014-02-22 DIAGNOSIS — I739 Peripheral vascular disease, unspecified: Secondary | ICD-10-CM | POA: Diagnosis not present

## 2014-02-22 DIAGNOSIS — E785 Hyperlipidemia, unspecified: Secondary | ICD-10-CM | POA: Diagnosis not present

## 2014-02-22 DIAGNOSIS — I10 Essential (primary) hypertension: Secondary | ICD-10-CM | POA: Diagnosis not present

## 2014-02-22 DIAGNOSIS — I252 Old myocardial infarction: Secondary | ICD-10-CM | POA: Diagnosis not present

## 2014-02-22 DIAGNOSIS — I25119 Atherosclerotic heart disease of native coronary artery with unspecified angina pectoris: Secondary | ICD-10-CM | POA: Diagnosis not present

## 2014-02-24 DIAGNOSIS — I252 Old myocardial infarction: Secondary | ICD-10-CM | POA: Diagnosis not present

## 2014-02-28 DIAGNOSIS — E119 Type 2 diabetes mellitus without complications: Secondary | ICD-10-CM | POA: Diagnosis not present

## 2014-02-28 DIAGNOSIS — N400 Enlarged prostate without lower urinary tract symptoms: Secondary | ICD-10-CM | POA: Diagnosis not present

## 2014-02-28 DIAGNOSIS — E559 Vitamin D deficiency, unspecified: Secondary | ICD-10-CM | POA: Diagnosis not present

## 2014-02-28 DIAGNOSIS — Z0001 Encounter for general adult medical examination with abnormal findings: Secondary | ICD-10-CM | POA: Diagnosis not present

## 2014-02-28 DIAGNOSIS — D81818 Other biotin-dependent carboxylase deficiency: Secondary | ICD-10-CM | POA: Diagnosis not present

## 2014-02-28 DIAGNOSIS — I10 Essential (primary) hypertension: Secondary | ICD-10-CM | POA: Diagnosis not present

## 2014-02-28 DIAGNOSIS — Z23 Encounter for immunization: Secondary | ICD-10-CM | POA: Diagnosis not present

## 2014-02-28 DIAGNOSIS — G479 Sleep disorder, unspecified: Secondary | ICD-10-CM | POA: Diagnosis not present

## 2014-03-01 DIAGNOSIS — I252 Old myocardial infarction: Secondary | ICD-10-CM | POA: Diagnosis not present

## 2014-03-03 DIAGNOSIS — I10 Essential (primary) hypertension: Secondary | ICD-10-CM | POA: Diagnosis not present

## 2014-03-03 DIAGNOSIS — Z79899 Other long term (current) drug therapy: Secondary | ICD-10-CM | POA: Diagnosis not present

## 2014-03-03 DIAGNOSIS — Z87891 Personal history of nicotine dependence: Secondary | ICD-10-CM | POA: Diagnosis not present

## 2014-03-03 DIAGNOSIS — I739 Peripheral vascular disease, unspecified: Secondary | ICD-10-CM | POA: Diagnosis not present

## 2014-03-03 DIAGNOSIS — I2510 Atherosclerotic heart disease of native coronary artery without angina pectoris: Secondary | ICD-10-CM | POA: Diagnosis not present

## 2014-03-29 DIAGNOSIS — H04123 Dry eye syndrome of bilateral lacrimal glands: Secondary | ICD-10-CM | POA: Diagnosis not present

## 2014-03-29 DIAGNOSIS — Z961 Presence of intraocular lens: Secondary | ICD-10-CM | POA: Diagnosis not present

## 2014-06-02 DIAGNOSIS — I10 Essential (primary) hypertension: Secondary | ICD-10-CM | POA: Diagnosis not present

## 2014-06-02 DIAGNOSIS — E780 Pure hypercholesterolemia: Secondary | ICD-10-CM | POA: Diagnosis not present

## 2014-06-02 DIAGNOSIS — I2111 ST elevation (STEMI) myocardial infarction involving right coronary artery: Secondary | ICD-10-CM | POA: Diagnosis not present

## 2014-06-02 DIAGNOSIS — I25110 Atherosclerotic heart disease of native coronary artery with unstable angina pectoris: Secondary | ICD-10-CM | POA: Diagnosis not present

## 2014-09-13 DIAGNOSIS — M542 Cervicalgia: Secondary | ICD-10-CM | POA: Diagnosis not present

## 2014-09-13 DIAGNOSIS — M5416 Radiculopathy, lumbar region: Secondary | ICD-10-CM | POA: Diagnosis not present

## 2014-10-06 DIAGNOSIS — I2111 ST elevation (STEMI) myocardial infarction involving right coronary artery: Secondary | ICD-10-CM | POA: Diagnosis not present

## 2014-10-06 DIAGNOSIS — I2510 Atherosclerotic heart disease of native coronary artery without angina pectoris: Secondary | ICD-10-CM | POA: Diagnosis not present

## 2014-10-06 DIAGNOSIS — I25110 Atherosclerotic heart disease of native coronary artery with unstable angina pectoris: Secondary | ICD-10-CM | POA: Diagnosis not present

## 2014-10-06 DIAGNOSIS — I10 Essential (primary) hypertension: Secondary | ICD-10-CM | POA: Diagnosis not present

## 2014-10-06 DIAGNOSIS — E780 Pure hypercholesterolemia: Secondary | ICD-10-CM | POA: Diagnosis not present

## 2014-10-10 DIAGNOSIS — I10 Essential (primary) hypertension: Secondary | ICD-10-CM | POA: Diagnosis not present

## 2014-10-10 DIAGNOSIS — N400 Enlarged prostate without lower urinary tract symptoms: Secondary | ICD-10-CM | POA: Diagnosis not present

## 2014-10-10 DIAGNOSIS — I2510 Atherosclerotic heart disease of native coronary artery without angina pectoris: Secondary | ICD-10-CM | POA: Diagnosis not present

## 2014-10-10 DIAGNOSIS — M5416 Radiculopathy, lumbar region: Secondary | ICD-10-CM | POA: Diagnosis not present

## 2014-10-10 DIAGNOSIS — E1140 Type 2 diabetes mellitus with diabetic neuropathy, unspecified: Secondary | ICD-10-CM | POA: Diagnosis not present

## 2014-10-10 DIAGNOSIS — D81818 Other biotin-dependent carboxylase deficiency: Secondary | ICD-10-CM | POA: Diagnosis not present

## 2014-10-10 DIAGNOSIS — M542 Cervicalgia: Secondary | ICD-10-CM | POA: Diagnosis not present

## 2014-10-10 DIAGNOSIS — E559 Vitamin D deficiency, unspecified: Secondary | ICD-10-CM | POA: Diagnosis not present

## 2014-10-16 DIAGNOSIS — R3915 Urgency of urination: Secondary | ICD-10-CM | POA: Diagnosis not present

## 2014-10-18 DIAGNOSIS — R3915 Urgency of urination: Secondary | ICD-10-CM | POA: Diagnosis not present

## 2014-10-18 DIAGNOSIS — N401 Enlarged prostate with lower urinary tract symptoms: Secondary | ICD-10-CM | POA: Diagnosis not present

## 2014-11-24 DIAGNOSIS — M791 Myalgia: Secondary | ICD-10-CM | POA: Diagnosis not present

## 2014-11-24 DIAGNOSIS — M2550 Pain in unspecified joint: Secondary | ICD-10-CM | POA: Diagnosis not present

## 2014-12-14 DIAGNOSIS — E559 Vitamin D deficiency, unspecified: Secondary | ICD-10-CM | POA: Diagnosis not present

## 2014-12-14 DIAGNOSIS — E1140 Type 2 diabetes mellitus with diabetic neuropathy, unspecified: Secondary | ICD-10-CM | POA: Diagnosis not present

## 2014-12-14 DIAGNOSIS — M5416 Radiculopathy, lumbar region: Secondary | ICD-10-CM | POA: Diagnosis not present

## 2014-12-14 DIAGNOSIS — I2510 Atherosclerotic heart disease of native coronary artery without angina pectoris: Secondary | ICD-10-CM | POA: Diagnosis not present

## 2014-12-14 DIAGNOSIS — M791 Myalgia: Secondary | ICD-10-CM | POA: Diagnosis not present

## 2014-12-14 DIAGNOSIS — M542 Cervicalgia: Secondary | ICD-10-CM | POA: Diagnosis not present

## 2014-12-14 DIAGNOSIS — I10 Essential (primary) hypertension: Secondary | ICD-10-CM | POA: Diagnosis not present

## 2014-12-14 DIAGNOSIS — M2550 Pain in unspecified joint: Secondary | ICD-10-CM | POA: Diagnosis not present

## 2014-12-14 DIAGNOSIS — N400 Enlarged prostate without lower urinary tract symptoms: Secondary | ICD-10-CM | POA: Diagnosis not present

## 2014-12-14 DIAGNOSIS — D81818 Other biotin-dependent carboxylase deficiency: Secondary | ICD-10-CM | POA: Diagnosis not present

## 2015-01-19 DIAGNOSIS — I10 Essential (primary) hypertension: Secondary | ICD-10-CM | POA: Diagnosis not present

## 2015-01-19 DIAGNOSIS — I2111 ST elevation (STEMI) myocardial infarction involving right coronary artery: Secondary | ICD-10-CM | POA: Diagnosis not present

## 2015-01-19 DIAGNOSIS — I25110 Atherosclerotic heart disease of native coronary artery with unstable angina pectoris: Secondary | ICD-10-CM | POA: Diagnosis not present

## 2015-01-19 DIAGNOSIS — E7800 Pure hypercholesterolemia, unspecified: Secondary | ICD-10-CM | POA: Diagnosis not present

## 2015-04-20 DIAGNOSIS — E538 Deficiency of other specified B group vitamins: Secondary | ICD-10-CM | POA: Diagnosis not present

## 2015-04-20 DIAGNOSIS — I10 Essential (primary) hypertension: Secondary | ICD-10-CM | POA: Diagnosis not present

## 2015-04-20 DIAGNOSIS — Z7984 Long term (current) use of oral hypoglycemic drugs: Secondary | ICD-10-CM | POA: Diagnosis not present

## 2015-04-20 DIAGNOSIS — R05 Cough: Secondary | ICD-10-CM | POA: Diagnosis not present

## 2015-04-20 DIAGNOSIS — D81818 Other biotin-dependent carboxylase deficiency: Secondary | ICD-10-CM | POA: Diagnosis not present

## 2015-04-20 DIAGNOSIS — M791 Myalgia: Secondary | ICD-10-CM | POA: Diagnosis not present

## 2015-04-20 DIAGNOSIS — M5416 Radiculopathy, lumbar region: Secondary | ICD-10-CM | POA: Diagnosis not present

## 2015-04-20 DIAGNOSIS — E559 Vitamin D deficiency, unspecified: Secondary | ICD-10-CM | POA: Diagnosis not present

## 2015-04-20 DIAGNOSIS — N400 Enlarged prostate without lower urinary tract symptoms: Secondary | ICD-10-CM | POA: Diagnosis not present

## 2015-04-20 DIAGNOSIS — I2510 Atherosclerotic heart disease of native coronary artery without angina pectoris: Secondary | ICD-10-CM | POA: Diagnosis not present

## 2015-04-20 DIAGNOSIS — E1140 Type 2 diabetes mellitus with diabetic neuropathy, unspecified: Secondary | ICD-10-CM | POA: Diagnosis not present

## 2015-04-26 DIAGNOSIS — Z961 Presence of intraocular lens: Secondary | ICD-10-CM | POA: Diagnosis not present

## 2015-04-26 DIAGNOSIS — H04123 Dry eye syndrome of bilateral lacrimal glands: Secondary | ICD-10-CM | POA: Diagnosis not present

## 2015-05-14 DIAGNOSIS — Z0000 Encounter for general adult medical examination without abnormal findings: Secondary | ICD-10-CM | POA: Diagnosis not present

## 2015-05-14 DIAGNOSIS — E559 Vitamin D deficiency, unspecified: Secondary | ICD-10-CM | POA: Diagnosis not present

## 2015-05-14 DIAGNOSIS — I2510 Atherosclerotic heart disease of native coronary artery without angina pectoris: Secondary | ICD-10-CM | POA: Diagnosis not present

## 2015-05-14 DIAGNOSIS — Z79899 Other long term (current) drug therapy: Secondary | ICD-10-CM | POA: Diagnosis not present

## 2015-05-14 DIAGNOSIS — M5416 Radiculopathy, lumbar region: Secondary | ICD-10-CM | POA: Diagnosis not present

## 2015-05-14 DIAGNOSIS — N400 Enlarged prostate without lower urinary tract symptoms: Secondary | ICD-10-CM | POA: Diagnosis not present

## 2015-05-14 DIAGNOSIS — D81818 Other biotin-dependent carboxylase deficiency: Secondary | ICD-10-CM | POA: Diagnosis not present

## 2015-05-14 DIAGNOSIS — E1140 Type 2 diabetes mellitus with diabetic neuropathy, unspecified: Secondary | ICD-10-CM | POA: Diagnosis not present

## 2015-05-14 DIAGNOSIS — R531 Weakness: Secondary | ICD-10-CM | POA: Diagnosis not present

## 2015-05-14 DIAGNOSIS — G629 Polyneuropathy, unspecified: Secondary | ICD-10-CM | POA: Diagnosis not present

## 2015-05-14 DIAGNOSIS — M791 Myalgia: Secondary | ICD-10-CM | POA: Diagnosis not present

## 2015-05-14 DIAGNOSIS — I10 Essential (primary) hypertension: Secondary | ICD-10-CM | POA: Diagnosis not present

## 2015-05-31 ENCOUNTER — Other Ambulatory Visit: Payer: Self-pay

## 2015-05-31 DIAGNOSIS — G609 Hereditary and idiopathic neuropathy, unspecified: Secondary | ICD-10-CM

## 2015-06-01 NOTE — Patient Outreach (Signed)
[facility]) Care Management  06/01/2015  Lynden Sollman 1928/10/15 [id number]   NOTE: for  05/31/15  SUBJECTIVE: Telephone call to patient regarding primary MD referral. HIPAA verified with patient. Discussed and offered [facility] care management services with patient. Patient verbally agreed to [facility] care management services.  Patient states he is most concerned about his constant fatigue and leg pain.  Patient states he has neuropathy. States his legs hurt from the knees down.  Patient states he is on medication for this but he still has quite a bit of leg pain. Patient states he has burning and pain in his legs. Patient states he uses a cane for ambulation. Patient states he has fallen at least 4 times within the past year. Patient denies any serious injury with the falls. Patient states he is tired all the time.  Patient states, "I just can't do anything."  Patient reports he has felt tired for at least 1 year.  Patient states he does not eat a lot.  Patient reports he has lost weight.  Patient states his current weight is 166lbs.  Patient denies any emergency room or hospitalizations within the past year. Patient states he had a heart attack in June 2015.  Patient reports he is being seen by cardiologist, Dr. Kelle Darting in [place], [place]. Patient states he is seen every 6 months and his next appointment is in April 2017.  Patient states he is able to afford all of his medications. Patient states he has transportation to his appointments.   ASSESSMENT: Referral per primary MD for neuropathy, fatigue, and help with medication regime.  Per primary MD note"patient needs assistance with making sure his medication regime is being followed" Patient would also benefit from home safety evaluation due to frequent falls.  PLAN: RNCM will refer patient to community case manager  Quinn Plowman RN,BSN,CCM [facility] Telephonic  [phone]

## 2015-06-04 ENCOUNTER — Other Ambulatory Visit: Payer: Self-pay | Admitting: *Deleted

## 2015-06-04 NOTE — Patient Outreach (Signed)
[facility]) Care Management  06/04/2015  Sriansh Lizardi [birth year] [id number]   Initial contact with patient Community care Coordinator referral from telephonic care manager.  RNCM spoke with patient , HIPAA verified.  Mr.Thorup report he is having a good day, discussed his concern related to falls.  Patient agreeable to home visit for fall safety evaluation and medication review. Denies further concerns at this time.  Plan RNCM will meet patient in home on 3/22 at 0930.  Joylene Draft, RN, [facility] [phone]- Mobile [phone]- Toll Free Main Office

## 2015-06-05 ENCOUNTER — Encounter: Payer: Self-pay | Admitting: *Deleted

## 2015-06-13 ENCOUNTER — Other Ambulatory Visit: Payer: Self-pay | Admitting: *Deleted

## 2015-06-13 ENCOUNTER — Encounter: Payer: Self-pay | Admitting: *Deleted

## 2015-06-13 VITALS — BP 106/70 | HR 98

## 2015-06-13 DIAGNOSIS — I219 Acute myocardial infarction, unspecified: Secondary | ICD-10-CM | POA: Insufficient documentation

## 2015-06-13 DIAGNOSIS — I10 Essential (primary) hypertension: Secondary | ICD-10-CM

## 2015-06-13 DIAGNOSIS — G629 Polyneuropathy, unspecified: Secondary | ICD-10-CM | POA: Insufficient documentation

## 2015-06-13 DIAGNOSIS — N400 Enlarged prostate without lower urinary tract symptoms: Secondary | ICD-10-CM | POA: Insufficient documentation

## 2015-06-13 NOTE — Patient Outreach (Signed)
[facility]) Care Management   06/13/2015  Jeffrey Underwood Jun 20, 1928 [id number]  Jeffrey Underwood is an 82 y.o. male  Subjective: Patient discussed being disappointed that he is not able to do his yard work as he has done in the past, due to his weakness in his legs at times and being afraid he will fall.  Objective:  BP 106/70 mmHg  Pulse 98  SpO2 97% Review of Systems  Constitutional: Negative.   HENT: Negative.   Eyes: Negative.   Respiratory: Negative.   Cardiovascular: Negative for chest pain.  Gastrointestinal: Negative.   Genitourinary: Negative.   Musculoskeletal: Negative.   Skin: Negative.   Neurological: Negative for dizziness.  Psychiatric/Behavioral: Negative.  Negative for depression and suicidal ideas.    Physical Exam  Constitutional: He is oriented to person, place, and time. He appears well-developed and well-nourished.  Cardiovascular: Normal rate, normal heart sounds and intact distal pulses.   Respiratory: Effort normal and breath sounds normal.  GI: Soft.  Musculoskeletal:  Patient complaint of neck stiffness, states he wears a cervical collar when driving  Neurological: He is alert and oriented to person, place, and time.  Skin: Skin is warm and dry.  Psychiatric: He has a normal mood and affect. His behavior is normal. Judgment and thought content normal.    Current Medications:   Current Outpatient Prescriptions  Medication Sig Dispense Refill  . aspirin 81 MG tablet Take 81 mg by mouth daily.    . carvedilol (COREG) 6.25 MG tablet Take 6.25 mg by mouth 2 (two) times daily with a meal.    . cholecalciferol (VITAMIN D) 1000 UNITS tablet Take 1,000 Units by mouth daily.    . Cyanocobalamin (VITAMIN B 12 PO) Take 1 tablet by mouth at bedtime.    . nortriptyline (PAMELOR) 25 MG capsule Take 25 mg by mouth at bedtime.    . Omega-3 Krill Oil 300 MG CAPS Take 300 mg by mouth daily.    . Saw Palmetto, Serenoa repens, (SAW PALMETTO PO)  Take 1 tablet by mouth daily.    Marland Kitchen atorvastatin (LIPITOR) 80 MG tablet Take 80 mg by mouth daily. Reported on 06/13/2015    . clopidogrel (PLAVIX) 75 MG tablet Take 75 mg by mouth daily. Reported on 06/13/2015    . famotidine (PEPCID) 10 MG tablet Take 10 mg by mouth 2 (two) times daily. Reported on 06/13/2015    . gabapentin (NEURONTIN) 600 MG tablet Take 600 mg by mouth 3 (three) times daily. Reported on 06/13/2015    . lisinopril (PRINIVIL,ZESTRIL) 10 MG tablet Take 10 mg by mouth daily. Reported on 06/13/2015    . naproxen sodium (ANAPROX) 220 MG tablet Take 220 mg by mouth 2 (two) times daily with a meal. Reported on 06/13/2015    . nitroGLYCERIN (NITROSTAT) 0.4 MG SL tablet Place 0.4 mg under the tongue every 5 (five) minutes as needed for chest pain. Reported on 06/13/2015     No current facility-administered medications for this visit.    Functional Status:   In your present state of health, do you have any difficulty performing the following activities: 06/13/2015  Hearing? Y  Vision? N  Difficulty concentrating or making decisions? Y  Walking or climbing stairs? Y  Dressing or bathing? N  Doing errands, shopping? N  Preparing Food and eating ? N  Using the Toilet? N  In the past six months, have you accidently leaked urine? N  Do you have problems with loss of bowel control?  N  Managing your Medications? N  Managing your Finances? N  Housekeeping or managing your Housekeeping? Y   Fall Risk  06/13/2015 05/31/2015  Falls in the past year? Yes Yes  Number falls in past yr: 2 or more 2 or more  Injury with Fall? No No  Risk Factor Category  High Fall Risk High Fall Risk  Risk for fall due to : History of fall(s);Impaired balance/gait History of fall(s)  Follow up Falls evaluation completed;Education provided;Falls prevention discussed (No Data)   Fall/Depression Screening:    PHQ 2/9 Scores 06/13/2015 05/31/2015  PHQ - 2 Score 1 1    Assessment:  Routine home visit, home very neat  and orderly, patient's daughter Jeffrey Underwood home during the visit.   Medication Review Reviewed patient current medications, that he is taking, patient presented all medication bottles he has knowledge of use of each medication. Patient also presented medication list from his recent MD after visit summary. Mr.Jeffrey Underwood keeps his pills in a weekly organizer and is able to fill on his own on a weekly basis. The only medication that he did not have was Red yeast rice and she states that he will get that. Noted Mr.Jeffrey Underwood does have nitroglycerin present but he has had the same bottle since his MI in 2015. Educated patient on how to use nitroglycerin for chest pain and importance of having a fresh bottle. Patient reports he will get a new bottle, his has a follow up visit with his cardiologist in [place] on April 28.  Patient denies any chest pain or discomfort. Managing medications well.  Fall/Safety Concerns Mr.Jeffrey Underwood discussed his recent falls, stating his legs just give away at times. Reviewed fall prevention safety measures. Home safety evaluation completed, noted scatter rug in living room. Patient has a cane that he uses when he goes out from home, recommended to use at home as he has several step down areas in his home. Mr. Jeffrey Underwood does drive just locally to run errands , his daughters provided transportation out to out of town appointments . Benefit from fall prevention education.  Decreased Appetite/Nutrition concerns Mr.Jeffrey Underwood reports decreased appetite, and at least 4 pound weight loss in the last month. Discussed importance of balanced diet with protein. Discussed trying small frequent meals, trying supplement such as ensure.   Plan:  RN care manager will follow up with a home visit April 24,to evaluate progress toward goals Patient to contact MD with new symptoms or concerns.  [facility] CM Care Plan Problem One        Most Recent Value   Care Plan Problem One  Frequent falls   Role Documenting  the Problem One  Care Management [place] for Problem One  Active   THN Long Term Goal (31-90 days)  Patient will report no falls in the next 31 days   THN Long Term Goal Start Date  06/13/15   Interventions for Problem One Long Term Goal  Educated fall prevention, strategies, discussed wearing supportive shoes, non slip socks in home, standing slowly from sitting position prior to walking, Reviewed fall handout in [facility] folder   [facility]N CM Short Term Goal #1 (0-30 days)  Patient will report doing daily chair exercises in the next 30 days   THN CM Short Term Goal #1 Start Date  06/13/15   Interventions for Short Term Goal #1  Educated patient on benefits of exercise to improve strength and balance, demonstrated exercise he can do while sitting in his  chair    THN CM Short Term Goal #2 (0-30 days)  Patient will report increase use of his cane while in doors to support balance    THN CM Short Term Goal #2 Start Date  06/13/15   Interventions for Short Term Goal #2  Educated patient on how walking assist such as canes, rolling walkers can prevent falls or severity of injury.    [facility] CM Care Plan Problem Two        Most Recent Value   Care Plan Problem Two  Concern for patient weight loss and decreased appetite   Role Documenting the Problem Two  Care Management [place] for Problem Two  Active   Interventions for Problem Two Long Term Goal   Educated patient on the importance of a proper diet, balanced with protein and carbohydrates, discussed patient likes and normal eating pattern, suggested having snacks between meals , .    THN Long Term Goal (31-90) days  Patient will report no increase in weight loss in the next 31 days and improve nutrition intake    THN Long Term Goal Start Date  06/13/15   THN CM Short Term Goal #1 (0-30 days)  Patient will report buying a scale and weighing at least weekly in the next 30 days   THN CM Short Term Goal #1 Start Date  06/13/15    Interventions for Short Term Goal #2   Educated on benefits of weighing to track for continued unintentional weight loss   THN CM Short Term Goal #2 (0-30 days)  Patient will report making steps toward improved nutrition  intake in the next 30 days   THN CM Short Term Goal #2 Start Date  06/13/15   Interventions for Short Term Goal #2  Educated on the benefits eating well, to help with energy, increase resistence to infections,reviewed some supplements to try such as ensure when appetite is decreased.      Joylene Draft, RN, [place] Management [phone]- Mobile [phone]- Toll Free Main Office

## 2015-06-18 ENCOUNTER — Other Ambulatory Visit: Payer: Self-pay | Admitting: Internal Medicine

## 2015-06-18 ENCOUNTER — Ambulatory Visit
Admission: RE | Admit: 2015-06-18 | Discharge: 2015-06-18 | Disposition: A | Discharge status: Home / Self Care | Payer: Medicare Other | Source: Ambulatory Visit | Attending: Internal Medicine | Admitting: Internal Medicine

## 2015-06-18 DIAGNOSIS — R059 Cough, unspecified: Secondary | ICD-10-CM

## 2015-06-18 DIAGNOSIS — R0602 Shortness of breath: Secondary | ICD-10-CM | POA: Diagnosis not present

## 2015-06-18 DIAGNOSIS — R05 Cough: Secondary | ICD-10-CM

## 2015-07-03 ENCOUNTER — Other Ambulatory Visit: Payer: Self-pay | Admitting: Internal Medicine

## 2015-07-03 DIAGNOSIS — I10 Essential (primary) hypertension: Secondary | ICD-10-CM | POA: Diagnosis not present

## 2015-07-03 DIAGNOSIS — R634 Abnormal weight loss: Secondary | ICD-10-CM | POA: Diagnosis not present

## 2015-07-03 DIAGNOSIS — G629 Polyneuropathy, unspecified: Secondary | ICD-10-CM | POA: Diagnosis not present

## 2015-07-03 DIAGNOSIS — E1140 Type 2 diabetes mellitus with diabetic neuropathy, unspecified: Secondary | ICD-10-CM | POA: Diagnosis not present

## 2015-07-03 DIAGNOSIS — R05 Cough: Secondary | ICD-10-CM | POA: Diagnosis not present

## 2015-07-03 DIAGNOSIS — R531 Weakness: Secondary | ICD-10-CM | POA: Diagnosis not present

## 2015-07-03 DIAGNOSIS — R630 Anorexia: Secondary | ICD-10-CM

## 2015-07-03 DIAGNOSIS — F329 Major depressive disorder, single episode, unspecified: Secondary | ICD-10-CM | POA: Diagnosis not present

## 2015-07-03 DIAGNOSIS — I2510 Atherosclerotic heart disease of native coronary artery without angina pectoris: Secondary | ICD-10-CM | POA: Diagnosis not present

## 2015-07-06 ENCOUNTER — Other Ambulatory Visit: Payer: Medicare Other

## 2015-07-07 DIAGNOSIS — R634 Abnormal weight loss: Secondary | ICD-10-CM | POA: Diagnosis not present

## 2015-07-07 DIAGNOSIS — K8020 Calculus of gallbladder without cholecystitis without obstruction: Secondary | ICD-10-CM | POA: Diagnosis not present

## 2015-07-07 DIAGNOSIS — M5136 Other intervertebral disc degeneration, lumbar region: Secondary | ICD-10-CM | POA: Diagnosis not present

## 2015-07-07 DIAGNOSIS — K5730 Diverticulosis of large intestine without perforation or abscess without bleeding: Secondary | ICD-10-CM | POA: Diagnosis not present

## 2015-07-07 DIAGNOSIS — R918 Other nonspecific abnormal finding of lung field: Secondary | ICD-10-CM | POA: Diagnosis not present

## 2015-07-07 DIAGNOSIS — R630 Anorexia: Secondary | ICD-10-CM | POA: Diagnosis not present

## 2015-07-16 ENCOUNTER — Other Ambulatory Visit: Payer: Self-pay | Admitting: *Deleted

## 2015-07-16 VITALS — BP 122/78 | HR 100 | Resp 18 | Wt 157.0 lb

## 2015-07-16 DIAGNOSIS — I10 Essential (primary) hypertension: Secondary | ICD-10-CM

## 2015-07-16 DIAGNOSIS — N400 Enlarged prostate without lower urinary tract symptoms: Secondary | ICD-10-CM

## 2015-07-16 NOTE — Patient Outreach (Signed)
[facility]) Care Management   07/16/2015  Alek Borges [birth year] [id number]  Ellen Mayol is an 82 y.o. male  Subjective: " I feel good, just don't have much energy".  Objective:  BP 122/78 mmHg  Pulse 100  Resp 18  SpO2 97% Review of Systems  Constitutional: Negative.   HENT: Negative.   Eyes: Negative.   Respiratory: Negative for wheezing.        States some shortness of breath with increased activity  Cardiovascular: Negative.  Negative for leg swelling.  Gastrointestinal: Negative.   Genitourinary: Negative.   Musculoskeletal: Negative.  Negative for falls.  Skin: Negative.   Neurological: Negative.   Endo/Heme/Allergies: Negative.   Psychiatric/Behavioral: Negative.     Physical Exam  Constitutional: He is oriented to person, place, and time. He appears well-developed and well-nourished.  Cardiovascular: Normal rate and intact distal pulses.   Respiratory: Effort normal and breath sounds normal. No respiratory distress.  GI: Soft. Bowel sounds are normal.  Reports having normal bowel movement daily  Musculoskeletal:  States  neck stiffness  Neurological: He is alert and oriented to person, place, and time.  Skin: Skin is warm and dry.  Psychiatric: He has a normal mood and affect. His behavior is normal. Judgment and thought content normal.    Encounter Medications:   Outpatient Encounter Prescriptions as of 07/16/2015  Medication Sig Note  . aspirin 81 MG tablet Take 81 mg by mouth daily.   . Camphor-Menthol (TIGER BALM EXTRA STRENGTH) 11-10 % OINT Apply 1 application topically. Reported on 06/13/2015   . carvedilol (COREG) 6.25 MG tablet Take 6.25 mg by mouth 2 (two) times daily with a meal.   . cholecalciferol (VITAMIN D) 1000 UNITS tablet Take 1,000 Units by mouth daily.   . Cyanocobalamin (VITAMIN B 12 PO) Take 1 tablet by mouth at bedtime.   . gabapentin (NEURONTIN) 600 MG tablet Take 600 mg by mouth 3 (three) times daily. Reported  on 06/13/2015 06/13/2015: Not taking  . levofloxacin (LEVAQUIN) 500 MG tablet Take 500 mg by mouth daily. For 14 days   . mirtazapine (REMERON) 15 MG tablet Take 15 mg by mouth at bedtime.   . nitroGLYCERIN (NITROSTAT) 0.4 MG SL tablet Place 0.4 mg under the tongue every 5 (five) minutes as needed for chest pain. Reported on 06/13/2015 07/16/2015: Medication bottle in date  . nortriptyline (PAMELOR) 25 MG capsule Take 25 mg by mouth at bedtime.   . Omega-3 Krill Oil 300 MG CAPS Take 300 mg by mouth daily.   . Red Yeast Rice 600 MG CAPS Take 600 mg by mouth daily. Reported on 06/13/2015   . Saw Palmetto, Serenoa repens, (SAW PALMETTO PO) Take 1 tablet by mouth daily.   Marland Kitchen atorvastatin (LIPITOR) 80 MG tablet Take 80 mg by mouth daily. Reported on 07/16/2015 06/13/2015: Not taking  . clopidogrel (PLAVIX) 75 MG tablet Take 75 mg by mouth daily. Reported on 07/16/2015 06/13/2015: Not taking  . famotidine (PEPCID) 10 MG tablet Take 10 mg by mouth 2 (two) times daily. Reported on 07/16/2015 06/13/2015: Not taking  . lisinopril (PRINIVIL,ZESTRIL) 10 MG tablet Take 10 mg by mouth daily. Reported on 07/16/2015 06/13/2015: Not taking  . naproxen sodium (ANAPROX) 220 MG tablet Take 220 mg by mouth 2 (two) times daily with a meal. Reported on 07/16/2015 06/13/2015: Not taking   No facility-administered encounter medications on file as of 07/16/2015.    Functional Status:   In your present state of health, do you have  any difficulty performing the following activities: 06/13/2015  Hearing? Y  Vision? N  Difficulty concentrating or making decisions? Y  Walking or climbing stairs? Y  Dressing or bathing? N  Doing errands, shopping? N  Preparing Food and eating ? N  Using the Toilet? N  In the past six months, have you accidently leaked urine? N  Do you have problems with loss of bowel control? N  Managing your Medications? N  Managing your Finances? N  Housekeeping or managing your Housekeeping? Y    Fall/Depression  Screening:    PHQ 2/9 Scores 06/13/2015 05/31/2015  PHQ - 2 Score 1 1    Assessment:  Routine home visit, patient's daughter Pam present. Patient and daughter discussed patient's recent respiratory problem, that he visited PCP in office received chest xray and is now taking antibiotic. Reinforced with patient and daughter importance of completing entire prescription they voiced understanding. Mr.Castrillon denies shortness of breath, chest pain, states he has an occasional cough nonproductive. Patient's daughter's help him with managing his medication and appointments. Mr.Overfield is still able to drive locally.  Falls  Patient denies having a fall, he continues to use his cane in home, rolling walker when out of home, and continues to do daily chair exercises. Patient reports he is able to walk on the road in front of his home using his rolling walker, but has to sit down on seated walker halfway back due to feeling tired and short of breath, before his returns home.   Weight Loss/Decreased appetite Patient has experienced at least a 20 pound weight loss in the last 3 months, patient reports abdominal discomfort and follow up with PCP , Abdomen CT obtained patient states they did not see anything other than constipation,patient now using miralax a few days a week, reports regular bowel movement daily. Patient states he has had a little increase in his appetite since being started on Remeron. Patient denies nausea. Discussed with patient options of nutrition supplements such as ensure, boost and benefits. Mr.Younes continues to weigh at least 2 days a week as he agreed to. Mr.Edmonds states he will get some boost on today, as he states he wants to feel stronger.  Diabetes Mr.Pelly states he wanted to start checking his blood sugar again, he states he other meter is not working, asked about the process of getting a new one. Explained to patient and daughter to call MD office and ask for a prescription  for a new meter and strips to be called into his local pharmacy, daughter Jeannene Patella states she will do that on today. Mr. Riviera most recent A1c 6.1 in January , 2017, patient states per MD it has been under good control.  Mr.Frei has follow up appointment with his cardiologist on 4/28 and with Dr.Gates on 5/12 his daughter will provide transportation. Reinforced with patient and daughter to notify PCP of new problems or concerns and to call RNCM as needed, reinforced 24 nurse availability.   Discussed with patient and daughter case closure at next visit if care management goals met, they voiced understanding and agreement.    Plan:  RNCM will follow up with patient in home visit and plan case closure if  care management goals being met.   THN CM Care Plan Problem One        Most Recent Value   Care Plan Problem One  Frequent falls   Role Documenting the Problem One  Care Management [place] for Problem One  Active   THN Long Term Goal (31-90 days)  Patient will report no falls in the next 31 days   THN Long Term Goal Start Date  06/13/15   [facility] Long Term Goal Met Date  07/16/15   THN CM Short Term Goal #1 (0-30 days)  Patient will report doing daily chair exercises in the next 30 days   THN CM Short Term Goal #1 Start Date  06/13/15   [facility] CM Short Term Goal #1 Met Date  07/16/15   THN CM Short Term Goal #2 (0-30 days)  Patient will report increase use of his cane while in doors to support balance    THN CM Short Term Goal #2 Start Date  06/13/15   [facility] CM Short Term Goal #2 Met Date  07/16/15    [facility] CM Care Plan Problem Two        Most Recent Value   Care Plan Problem Two  Concern for patient weight loss and decreased appetite   Role Documenting the Problem Two  Care Management Coordinator   Care Plan for Problem Two  Active   Interventions for Problem Two Long Term Goal   Reinforced with  patient on the importance of a proper diet, balanced with protein and carbohydrates,  discussed patient likes and normal eating pattern, suggested having snacks between meals , .    THN Long Term Goal (31-90) days  Patient will report no increase in weight loss in the next 31 days and improve nutrition intake    THN Long Term Goal Start Date  06/13/15   THN CM Short Term Goal #1 (0-30 days)  Patient will report buying a scale and weighing at least weekly in the next 30 days   THN CM Short Term Goal #1 Start Date  06/13/15   [facility] CM Short Term Goal #1 Met Date   07/16/15   THN CM Short Term Goal #2 (0-30 days)  Patient will report making steps toward improved nutrition  intake in the next 30 days   THN CM Short Term Goal #2 Start Date  06/13/15   Interventions for Short Term Goal #2  Reinforced importance of taking remeron as directed by MD,  discussed benefits of medication to increase appetite, discussed eating  small meals throughout the day   THN CM Short Term Goal #3 (0-30 days)  Patient in will begin to drink nutrition drink at least one daily in the next 30 days.   THN CM Short Term Goal #3 Start Date  07/16/15   Interventions for Short Term Goal #3  Educated patient on importance of nutrition in healing the body and how nutrition drinks help boost calories ,protein that they body needs,daily , suggested drinking in between meals that he eats.       Joylene Draft, RN, [place] Management [phone]- Mobile [phone] Toll Free Main Office

## 2015-07-20 DIAGNOSIS — R000 Tachycardia, unspecified: Secondary | ICD-10-CM | POA: Diagnosis not present

## 2015-07-20 DIAGNOSIS — I2510 Atherosclerotic heart disease of native coronary artery without angina pectoris: Secondary | ICD-10-CM | POA: Diagnosis not present

## 2015-07-20 DIAGNOSIS — D508 Other iron deficiency anemias: Secondary | ICD-10-CM | POA: Diagnosis not present

## 2015-07-20 DIAGNOSIS — I10 Essential (primary) hypertension: Secondary | ICD-10-CM | POA: Diagnosis not present

## 2015-07-27 DIAGNOSIS — I77819 Aortic ectasia, unspecified site: Secondary | ICD-10-CM | POA: Diagnosis not present

## 2015-07-27 DIAGNOSIS — I25118 Atherosclerotic heart disease of native coronary artery with other forms of angina pectoris: Secondary | ICD-10-CM | POA: Diagnosis not present

## 2015-07-27 DIAGNOSIS — I2510 Atherosclerotic heart disease of native coronary artery without angina pectoris: Secondary | ICD-10-CM | POA: Diagnosis not present

## 2015-07-27 DIAGNOSIS — R000 Tachycardia, unspecified: Secondary | ICD-10-CM | POA: Diagnosis not present

## 2015-07-27 DIAGNOSIS — I10 Essential (primary) hypertension: Secondary | ICD-10-CM | POA: Diagnosis not present

## 2015-07-27 DIAGNOSIS — E782 Mixed hyperlipidemia: Secondary | ICD-10-CM | POA: Diagnosis not present

## 2015-08-03 DIAGNOSIS — R05 Cough: Secondary | ICD-10-CM | POA: Diagnosis not present

## 2015-08-03 DIAGNOSIS — R531 Weakness: Secondary | ICD-10-CM | POA: Diagnosis not present

## 2015-08-03 DIAGNOSIS — E1140 Type 2 diabetes mellitus with diabetic neuropathy, unspecified: Secondary | ICD-10-CM | POA: Diagnosis not present

## 2015-08-03 DIAGNOSIS — I2510 Atherosclerotic heart disease of native coronary artery without angina pectoris: Secondary | ICD-10-CM | POA: Diagnosis not present

## 2015-08-03 DIAGNOSIS — M160 Bilateral primary osteoarthritis of hip: Secondary | ICD-10-CM | POA: Diagnosis not present

## 2015-08-03 DIAGNOSIS — R630 Anorexia: Secondary | ICD-10-CM | POA: Diagnosis not present

## 2015-08-03 DIAGNOSIS — F329 Major depressive disorder, single episode, unspecified: Secondary | ICD-10-CM | POA: Diagnosis not present

## 2015-08-03 DIAGNOSIS — G629 Polyneuropathy, unspecified: Secondary | ICD-10-CM | POA: Diagnosis not present

## 2015-08-03 DIAGNOSIS — I10 Essential (primary) hypertension: Secondary | ICD-10-CM | POA: Diagnosis not present

## 2015-08-03 DIAGNOSIS — R634 Abnormal weight loss: Secondary | ICD-10-CM | POA: Diagnosis not present

## 2015-08-03 DIAGNOSIS — K5790 Diverticulosis of intestine, part unspecified, without perforation or abscess without bleeding: Secondary | ICD-10-CM | POA: Diagnosis not present

## 2015-08-06 ENCOUNTER — Ambulatory Visit: Payer: Self-pay | Admitting: *Deleted

## 2015-08-06 ENCOUNTER — Encounter: Payer: Self-pay | Admitting: *Deleted

## 2015-08-06 ENCOUNTER — Other Ambulatory Visit: Payer: Self-pay | Admitting: *Deleted

## 2015-08-06 NOTE — Patient Outreach (Signed)
Jeffrey Underwood) Care Management   08/06/2015  Jeffrey Underwood Aug 19, 1928 [id number]  Jeffrey Underwood is an 82 y.o. male  Subjective: Jeffrey Underwood discussed just feeling weak on some days. Jeffrey Underwood discussed Jeffrey Underwood recent problems with constipation , but now that Jeffrey Underwood takes miralax daily that has improved. Jeffrey Underwood's daughter discussed Jeffrey Underwood visit to cardiology and report of increased heart rate and medication changes. Jeffrey Underwood's daughter discussed Jeffrey Underwood recent falls when getting up from chair.  Objective:  BP 108/64 mmHg  Pulse 98  Resp 18  SpO2 97%  Jeffrey Underwood sitting in Jeffrey Underwood usual chair in living room, difficulty getting up from low sitting chair. Review of Systems  Constitutional: Negative.   HENT: Negative.   Eyes: Negative.   Respiratory: Negative.   Cardiovascular: Negative.   Gastrointestinal: Positive for constipation.  Genitourinary: Negative.   Musculoskeletal: Positive for falls.  Skin: Negative.   Neurological:       Complaint of some dizziness when first standing  Psychiatric/Behavioral: Negative.     Physical Exam  Constitutional: Jeffrey Underwood is oriented to person, place, and time. Jeffrey Underwood appears well-developed and well-nourished.  Cardiovascular: Normal rate, normal heart sounds and intact distal pulses.   Respiratory: Effort normal and breath sounds normal.  GI: Soft.  Neurological: Jeffrey Underwood is alert and oriented to person, place, and time.  Skin: Skin is warm and dry.  Psychiatric: Jeffrey Underwood has a normal mood and affect. Jeffrey Underwood behavior is normal. Judgment and thought content normal.    Encounter Medications:   Outpatient Encounter Prescriptions as of 08/06/2015  Medication Sig Note  . aspirin 81 MG tablet Take 81 mg by mouth daily.   . Camphor-Menthol (TIGER BALM EXTRA STRENGTH) 11-10 % OINT Apply 1 application topically. Reported on 06/13/2015   . cholecalciferol (VITAMIN D) 1000 UNITS tablet Take 1,000 Units by mouth daily.   . famotidine (PEPCID) 10 MG tablet Take 10 mg by mouth 2  (two) times daily. Reported on 07/16/2015 06/13/2015: Not taking  . isosorbide mononitrate (IMDUR) 30 MG 24 hr tablet Take 30 mg by mouth daily.   . metoprolol succinate (TOPROL-XL) 50 MG 24 hr tablet Take 50 mg by mouth daily. Take with or immediately following a meal.   . mirtazapine (REMERON) 15 MG tablet Take 15 mg by mouth at bedtime.   . nitroGLYCERIN (NITROSTAT) 0.4 MG SL tablet Place 0.4 mg under the tongue every 5 (five) minutes as needed for chest pain. Reported on 06/13/2015 07/16/2015: Medication bottle in date  . nortriptyline (PAMELOR) 25 MG capsule Take 25 mg by mouth at bedtime.   . Omega-3 Krill Oil 300 MG CAPS Take 300 mg by mouth daily.   . Red Yeast Rice 600 MG CAPS Take 600 mg by mouth daily. Reported on 06/13/2015   . Saw Palmetto, Serenoa repens, (SAW PALMETTO PO) Take 1 tablet by mouth daily.   Marland Kitchen atorvastatin (LIPITOR) 80 MG tablet Take 80 mg by mouth daily. Reported on 08/06/2015 06/13/2015: Not taking  . carvedilol (COREG) 6.25 MG tablet Take 6.25 mg by mouth 2 (two) times daily with a meal. Reported on 08/06/2015 08/06/2015: Not taking  . clopidogrel (PLAVIX) 75 MG tablet Take 75 mg by mouth daily. Reported on 08/06/2015 06/13/2015: Not taking  . Cyanocobalamin (VITAMIN B 12 PO) Take 1 tablet by mouth at bedtime. Reported on 08/06/2015   . gabapentin (NEURONTIN) 600 MG tablet Take 600 mg by mouth 3 (three) times daily. Reported on 08/06/2015 06/13/2015: Not taking  . levofloxacin (LEVAQUIN) 500 MG tablet Take 500 mg by  mouth daily. Reported on 08/06/2015   . lisinopril (PRINIVIL,ZESTRIL) 10 MG tablet Take 10 mg by mouth daily. Reported on 08/06/2015 06/13/2015: Not taking  . naproxen sodium (ANAPROX) 220 MG tablet Take 220 mg by mouth 2 (two) times daily with a meal. Reported on 08/06/2015 06/13/2015: Not taking   No facility-administered encounter medications on file as of 08/06/2015.    Functional Status:   In your present state of health, do you have any difficulty performing the  following activities: 08/06/2015 06/13/2015  Hearing? N Y  Vision? N N  Difficulty concentrating or making decisions? N Y  Walking or climbing stairs? Y Y  Dressing or bathing? N N  Doing errands, shopping? Y N  Preparing Food and eating ? N N  Using the Toilet? N N  In the past six months, have you accidently leaked urine? N N  Do you have problems with loss of bowel control? N N  Managing your Medications? Y N  Managing your Finances? N N  Housekeeping or managing your Housekeeping? N Y    Fall/Depression Screening:    PHQ 2/9 Scores 08/06/2015 06/13/2015 05/31/2015  PHQ - 2 Score 0 1 1    Assessment:     High Fall Risk Jeffrey Underwood 2 recent fall, one when just standing up from chair, and second fall Jeffrey Underwood was trying to put Jeffrey Underwood pajama pants on while sitting in Jeffrey Underwood usual chair. Jeffrey Underwood sits in a low sitting chair, observed Jeffrey Underwood having difficulty rising from chair, Jeffrey Underwood reports some dizziness when initially rising from chair but clears after standing a few seconds.  BP sitting 112/70 heart rate 97 BP standing 108/65 heart rate 98,   Fall prevention reviewed, encouraged Jeffrey Underwood to use Jeffrey Underwood rolling walker and the lift chair. Jeffrey Underwood continues to do Jeffrey Underwood chair exercises and is able to walk in level road with Jeffrey Underwood rolling walker on some days.  Weight Loss Jeffrey Underwood has scales and weighing at home, reports 2 pound weight gain in last month. Jeffrey Underwood is drinking carnation instant breakfast one day in addition to Jeffrey Underwood normal meal plans.  Discussed with Jeffrey Underwood regarding monitoring Jeffrey Underwood blood pressure and pulse at  home, she states Jeffrey Underwood grand daughter can check it for him once a week she has manual equipment  Jeffrey Underwood declined getting a machine to check .  Plan:  RN will follow up with Jeffrey Underwood in one month with a home visit to assess for further care management needs prior to discharge. Jeffrey Underwood centered  care goals reviewed and updated .   THN CM Care Plan Problem One        Most Recent Value   Care Plan  Problem One  Frequent falls   Role Documenting the Problem One  Care Management [place] for Problem One  Active   THN Long Term Goal (31-90 days)  Jeffrey Underwood will report no falls in the next 31 days   THN Long Term Goal Start Date  06/13/15   [facility] Long Term Goal Met Date  07/16/15   THN CM Short Term Goal #1 (0-30 days)  Jeffrey Underwood will report doing daily chair exercises in the next 30 days   THN CM Short Term Goal #1 Start Date  06/13/15   [facility] CM Short Term Goal #1 Met Date  07/16/15   THN CM Short Term Goal #2 (0-30 days)  Jeffrey Underwood will report increase use of Jeffrey Underwood cane while in doors to support balance    THN CM Short Term Goal #2  Start Date  06/13/15   THN CM Short Term Goal #2 Met Date  07/16/15   THN CM Short Term Goal #3 (0-30 days)  Jeffrey Underwood will not experience a fall in the next 30 days   THN CM Short Term Goal #3 Start Date  08/06/15   Interventions for Short [place]ern Goal #3  Reinforced with Jeffrey Underwood to rise slowly from sitting, and stand a few seconds before starting to walk,    THN CM Short Term Goal #4 (0-30 days)  Jeffrey Underwood will report increased use of Jeffrey Underwood rolling walker in the next 30 days   THN CM Short Term Goal #4 Start Date  08/06/15   Interventions for Short Term Goal #4  Educated Jeffrey Underwood on how the walker provides increased support and stability when walking   THN CM Short Term Goal #5 (0-30 days)  Jeffrey Underwood will report sitting in Jeffrey Underwood lift chair, in the next 30 days   THN CM Short Term Goal #5 Start Date  08/06/15   Interventions for Short Term Goal #5  Family has agreed to move lift chair to Jeffrey Underwood favorite spot in the living room. encouraged Jeffrey Underwood to use lift chair to help ease Jeffrey Underwood getting up     [facility] CM Care Plan Problem Two        Most Recent Value   Care Plan Problem Two  Concern for Jeffrey Underwood weight loss and decreased appetite   Role Documenting the Problem Two  Care Management Coordinator   Care Plan for Problem Two  Active   THN Long Term Goal (31-90) days   Jeffrey Underwood will report no increase in weight loss in the next 31 days and improve nutrition intake    THN Long Term Goal Start Date  06/13/15   THN Long Term Goal Met Date  08/06/15   THN CM Short Term Goal #1 (0-30 days)  Jeffrey Underwood will report buying a scale and weighing at least weekly in the next 30 days   THN CM Short Term Goal #1 Start Date  06/13/15   [facility] CM Short Term Goal #1 Met Date   07/16/15   THN CM Short Term Goal #2 (0-30 days)  Jeffrey Underwood will report making steps toward improved nutrition  intake in the next 30 days   THN CM Short Term Goal #2 Start Date  06/13/15   [facility] CM Short Term Goal #2 Met Date  08/06/15   THN CM Short Term Goal #3 (0-30 days)  Jeffrey Underwood in will begin to drink nutrition drink at least one daily in the next 30 days.   THN CM Short Term Goal #3 Start Date  07/16/15   [facility] CM Short Term Goal #3 Met Date  08/06/15     Joylene Draft, RN, [place] Management [phone]- Mobile [phone]- Toll Free Main Office

## 2015-08-31 DIAGNOSIS — I25118 Atherosclerotic heart disease of native coronary artery with other forms of angina pectoris: Secondary | ICD-10-CM | POA: Diagnosis not present

## 2015-08-31 DIAGNOSIS — I2510 Atherosclerotic heart disease of native coronary artery without angina pectoris: Secondary | ICD-10-CM | POA: Diagnosis not present

## 2015-08-31 DIAGNOSIS — E782 Mixed hyperlipidemia: Secondary | ICD-10-CM | POA: Diagnosis not present

## 2015-08-31 DIAGNOSIS — I10 Essential (primary) hypertension: Secondary | ICD-10-CM | POA: Diagnosis not present

## 2015-08-31 DIAGNOSIS — E7800 Pure hypercholesterolemia, unspecified: Secondary | ICD-10-CM | POA: Diagnosis not present

## 2015-08-31 DIAGNOSIS — I739 Peripheral vascular disease, unspecified: Secondary | ICD-10-CM | POA: Diagnosis not present

## 2015-08-31 DIAGNOSIS — R000 Tachycardia, unspecified: Secondary | ICD-10-CM | POA: Diagnosis not present

## 2015-09-03 ENCOUNTER — Encounter: Payer: Self-pay | Admitting: *Deleted

## 2015-09-03 ENCOUNTER — Other Ambulatory Visit: Payer: Self-pay | Admitting: *Deleted

## 2015-09-03 NOTE — Patient Outreach (Signed)
[facility]) Care Management   09/03/2015  Avram Danielson 1928-06-03 [id number]  Jeffrey Underwood is an 82 y.o. male  Subjective:  Patient complaint of blurry vision , while resting in chair this am, some  dizziness when first standing up. Patient discussed he has gained 3 pounds since last visit. Patient discussed his recent visit to cardiologist  in [facility], states he got a good report, blood pressure and heart rate was good. Mr.Melendrez discussed that he still mows the grass and is able to walk along road at his home, last walk 2 to 3 days ago and does daily chair exercises.  Objective:  BP 104/60 mmHg  Pulse 98  Resp 18  SpO2 96% Review of Systems  Constitutional: Negative.   HENT: Negative.   Eyes: Positive for blurred vision.  Respiratory: Negative.   Cardiovascular: Negative.   Gastrointestinal: Negative.   Genitourinary: Negative.   Musculoskeletal: Negative.  Negative for falls.  Skin: Negative.   Neurological: Negative.   Endo/Heme/Allergies: Negative.   Psychiatric/Behavioral: Negative.        Forgetful     Physical Exam  Constitutional: He is oriented to person, place, and time. He appears well-developed and well-nourished.  Cardiovascular: Normal rate and normal heart sounds.   Respiratory: Effort normal.  GI: Soft.  Neurological: He is alert and oriented to person, place, and time.  Skin: Skin is warm and dry.  Psychiatric: He has a normal mood and affect. His behavior is normal. Judgment and thought content normal.    Encounter Medications:   Outpatient Encounter Prescriptions as of 09/03/2015  Medication Sig Note  . aspirin 81 MG tablet Take 81 mg by mouth daily.   . Camphor-Menthol (TIGER BALM EXTRA STRENGTH) 11-10 % OINT Apply 1 application topically. Reported on 06/13/2015   . cholecalciferol (VITAMIN D) 1000 UNITS tablet Take 1,000 Units by mouth daily.   . isosorbide mononitrate (IMDUR) 30 MG 24 hr tablet Take 30 mg by mouth  daily.   . metoprolol succinate (TOPROL-XL) 50 MG 24 hr tablet Take 50 mg by mouth daily. Take with or immediately following a meal.   . mirtazapine (REMERON) 15 MG tablet Take 15 mg by mouth at bedtime.   . nitroGLYCERIN (NITROSTAT) 0.4 MG SL tablet Place 0.4 mg under the tongue every 5 (five) minutes as needed for chest pain. Reported on 06/13/2015 07/16/2015: Medication bottle in date  . nortriptyline (PAMELOR) 25 MG capsule Take 25 mg by mouth at bedtime.   . Omega-3 Krill Oil 300 MG CAPS Take 300 mg by mouth daily.   . polyethylene glycol (MIRALAX / GLYCOLAX) packet Take 17 g by mouth daily.   . Red Yeast Rice 600 MG CAPS Take 600 mg by mouth daily. Reported on 06/13/2015   . Saw Palmetto, Serenoa repens, (SAW PALMETTO PO) Take 1 tablet by mouth daily.   Marland Kitchen atorvastatin (LIPITOR) 80 MG tablet Take 80 mg by mouth daily. Reported on 09/03/2015 06/13/2015: Not taking  . carvedilol (COREG) 6.25 MG tablet Take 6.25 mg by mouth 2 (two) times daily with a meal. Reported on 09/03/2015 08/06/2015: Not taking  . clopidogrel (PLAVIX) 75 MG tablet Take 75 mg by mouth daily. Reported on 09/03/2015 06/13/2015: Not taking  . Cyanocobalamin (VITAMIN B 12 PO) Take 1 tablet by mouth at bedtime. Reported on 09/03/2015   . famotidine (PEPCID) 10 MG tablet Take 10 mg by mouth 2 (two) times daily. Reported on 09/03/2015 06/13/2015: Not taking  . gabapentin (NEURONTIN) 600 MG tablet  Take 600 mg by mouth 3 (three) times daily. Reported on 09/03/2015 06/13/2015: Not taking  . levofloxacin (LEVAQUIN) 500 MG tablet Take 500 mg by mouth daily. Reported on 09/03/2015   . lisinopril (PRINIVIL,ZESTRIL) 10 MG tablet Take 10 mg by mouth daily. Reported on 09/03/2015 06/13/2015: Not taking  . naproxen sodium (ANAPROX) 220 MG tablet Take 220 mg by mouth 2 (two) times daily with a meal. Reported on 09/03/2015 06/13/2015: Not taking   No facility-administered encounter medications on file as of 09/03/2015.    Functional Status:   In your present  state of health, do you have any difficulty performing the following activities: 08/06/2015 06/13/2015  Hearing? N Y  Vision? N N  Difficulty concentrating or making decisions? N Y  Walking or climbing stairs? Y Y  Dressing or bathing? N N  Doing errands, shopping? Y N  Preparing Food and eating ? N N  Using the Toilet? N N  In the past six months, have you accidently leaked urine? N N  Do you have problems with loss of bowel control? N N  Managing your Medications? Y N  Managing your Finances? N N  Housekeeping or managing your Housekeeping? N Y    Fall/Depression Screening:    PHQ 2/9 Scores 08/06/2015 06/13/2015 05/31/2015  PHQ - 2 Score 0 1 1    Assessment:  Routine home visit, patient's daughter Darl Pikes present.  Patient has complained of having blurry vision on this am, slight dizziness when standing. Blood sugar checked during visit 116 Blood pressure sitting,  right arm 96/60, standing 90/60, patient states slightly dizzy when first standing then cleared. Rechecked  blood pressure after 15 minutes sitting- 104/60 in left arm. By end of visit and last blood pressure check patient denies blurry vision 102/60.   Fall Risk-  Remains high fall risk, no fall in last month, fall prevention strategies reinforced, to use walker in home as he says he uses outside.  Nutrition- Continues to drink, instant breakfast drink daily and no further weight loss.    Plan:  RNCM will plan follow up telephone visit in one week, and discuss further care management needs and patient centered goals Will notify MD office of patient compliant, placed call to Dr.Gates office left a message on nurse line regarding patient blurry vision complaint and blood pressures during visit and update patient.  THN CM Care Plan Problem One        Most Recent Value   Care Plan Problem One  Frequent falls   Role Documenting the Problem One  Care Management [place] for Problem One  Active   THN  Long Term Goal (31-90 days)  Patient will report no falls in the next 31 days   THN Long Term Goal Start Date  06/13/15   [facility] Long Term Goal Met Date  07/16/15   THN CM Short Term Goal #1 (0-30 days)  Patient will report doing daily chair exercises in the next 30 days   THN CM Short Term Goal #1 Start Date  06/13/15   [facility] CM Short Term Goal #1 Met Date  07/16/15   THN CM Short Term Goal #2 (0-30 days)  Patient will report increase use of his cane while in doors to support balance    THN CM Short Term Goal #2 Start Date  06/13/15   [facility] CM Short Term Goal #2 Met Date  07/16/15   THN CM Short Term Goal #3 (0-30 days)  Patient will not experience a fall in the next 30 days   THN CM Short Term Goal #3 Start Date  08/06/15   Interventions for Short Tern Goal #3  Reviewed  with patient importanc to rise slowly from sitting, and stand a few seconds before starting to walk,    THN CM Short Term Goal #4 (0-30 days)  Patient will report increased use of his rolling walker in the next 30 days   THN CM Short Term Goal #4 Start Date  08/06/15   Interventions for Short Term Goal #4  Reinforced with to use  the walker as it  provides increased support and stability when walking   THN CM Short Term Goal #5 (0-30 days)  Patient will report sitting in his lift chair, in the next 30 days   THN CM Short Term Goal #5 Start Date  08/06/15   Interventions for Short Term Goal #5  -- [Patient has declined to sit in his lift chair.]    [facility] CM Care Plan Problem Two        Most Recent Value   Care Plan Problem Two  Concern for patient weight loss and decreased appetite   Role Documenting the Problem Two  Care Management [place] for Problem Two  Active   THN Long Term Goal (31-90) days  Patient will report no increase in weight loss in the next 31 days and improve nutrition intake    THN Long Term Goal Start Date  06/13/15   THN Long Term Goal Met Date  08/06/15   THN CM Short Term Goal #1 (0-30 days)   Patient will report buying a scale and weighing at least weekly in the next 30 days   THN CM Short Term Goal #1 Start Date  06/13/15   [facility] CM Short Term Goal #1 Met Date   07/16/15   THN CM Short Term Goal #2 (0-30 days)  Patient will report making steps toward improved nutrition  intake in the next 30 days   THN CM Short Term Goal #2 Start Date  06/13/15   [facility] CM Short Term Goal #2 Met Date  08/06/15   THN CM Short Term Goal #3 (0-30 days)  Patient in will begin to drink nutrition drink at least one daily in the next 30 days.   THN CM Short Term Goal #3 Start Date  07/16/15   [facility] CM Short Term Goal #3 Met Date  08/06/15    [facility] CM Care Plan Problem Three        Most Recent Value   Care Plan Problem Three  Concern related to symtpoms   Role Documenting the Problem Three  Care Management Coordinator   Care Plan for Problem Three  Active   THN Long Term Goal (31-90) days  Patient/caregiver will verbalize increased knowlege related to  hypertension management strategies in the next 31 days     THN Long Term Goal Start Date  09/03/15   Interventions for Problem Three Long Term Goal  Educated patient/caregiver educated on importance of knowing what blood pressure,reading is and how increased salt intake affects blood pressure.      THN CM Short Term Goal #1 (0-30 days)  Patient/caregiver will check blood pressure at least weekly in the next 30 days   THN CM Short Term Goal #1 Start Date  09/03/15   Interventions for Short Term Goal #1  Verified that family has blood pressure monitor , and knowlege  of how to use to monitor, per daughter  her granddaughter will be able to monitor blood pressure   THN CM Short Term Goal #2 (0-30 days)  Patient/caregiver will report increase knowledge of when to notify MD concerns in the next 30 days   THN CM Short Term Goal #2 Start Date  09/03/15   Interventions for Short Term Goal #2  Discussed with patient/caregiver to notify of MD of new symptoms of dizziness,  blurry vision  blood pressure reading lower or higher than patient's normal, reviewed patient's recent blood pressure readings ,      Joylene Draft, RN, [place] Management [phone]- Mobile [phone]- [place]

## 2015-09-03 NOTE — Patient Outreach (Signed)
[facility]) Care Management  09/03/2015  Jeffrey Underwood Jul 23, 1928 [id number]   Incoming call from Jeffrey Underwood from [facility] office, she was calling to follow up my call to the office earlier related to patient complaint of blurry vision and blood pressure readings.  Jeffrey Underwood instructed me to notify patient to change Metoprolol to 1/2 tablet daily.  RN placed call to Jeffrey Underwood, spoke with his daughter Jeffrey Underwood, patient's daughter that lives with him,  to give her the message she was able to read back instructions to change dose of Metoprolol to 1/2 tablet daily.  Encouraged Jeffrey Underwood to notify Dr. Inda Merlin office with any further complaints and low blood pressure readings or elevated heart rate greater than 100.   Joylene Draft, RN, [place] Management [phone]- Mobile [phone]- Toll Free Main Office

## 2015-09-10 ENCOUNTER — Other Ambulatory Visit: Payer: Self-pay | Admitting: *Deleted

## 2015-09-10 NOTE — Patient Outreach (Signed)
[facility]) Care Management  09/10/2015  Jeffrey Underwood Sep 04, 1928 [id number]   Telephone assessment RN placed follow up call to Jeffrey Underwood, patient reports that is doing fine,denies any further problems with complaint of blurry vision and no falls.  Jeffrey Underwood gave me the okay to speak with his daughter Jeffrey Underwood, she reports that they have been checking patient's blood pressure and readings have been 110- 116/70, and patient taking medications as prescribed.  Plan Will plan home visit in next 3 weeks and will close case if no further care management concerns identified, patient in agreement.  Joylene Draft, RN, [place] Management [phone]- Mobile [phone]- Toll Free Main Office .

## 2015-10-01 ENCOUNTER — Other Ambulatory Visit: Payer: Self-pay | Admitting: *Deleted

## 2015-10-01 ENCOUNTER — Encounter: Payer: Self-pay | Admitting: *Deleted

## 2015-10-01 NOTE — Patient Outreach (Signed)
[facility]) Care Management   10/01/2015  Aly Hauser 11-Oct-1928 [id number]  Chang Tiggs is an 82 y.o. male  Subjective:  I'm still hanging in there. Patient discussed his usual activity, doing chair exercises and daily walks outside,and states he has already used his exercise machine in home, denies recent falls, still able to drive some locally. Mr.Penman complains having a nonproductive cough,denies shortness of breath, cough nonproductive. Mr.Strothman states his weight is staying about the same, no further weight loss, reports it doesn't take much to feel him up though.  Objective: BP 116/64 mmHg  Pulse 85  Resp 18  SpO2 95% Review of Systems  Constitutional: Negative.   HENT: Negative.   Eyes: Negative.   Respiratory: Positive for cough.        Dry cough  Cardiovascular: Negative for chest pain and leg swelling.  Gastrointestinal: Negative.   Genitourinary: Negative.   Musculoskeletal: Positive for joint pain. Negative for falls.       Neck soreness  Skin: Negative.   Neurological: Negative.   Psychiatric/Behavioral: Negative.     Physical Exam  Constitutional: He is oriented to person, place, and time. He appears well-developed and well-nourished.  Cardiovascular: Normal rate, normal heart sounds and intact distal pulses.   Respiratory: Effort normal and breath sounds normal.  GI: Soft.  Neurological: He is alert and oriented to person, place, and time.  Skin: Skin is warm and dry.  Psychiatric: He has a normal mood and affect. His behavior is normal. Judgment and thought content normal.    Encounter Medications:   Outpatient Encounter Prescriptions as of 10/01/2015  Medication Sig Note  . aspirin 81 MG tablet Take 81 mg by mouth daily.   . Camphor-Menthol (TIGER BALM EXTRA STRENGTH) 11-10 % OINT Apply 1 application topically. Reported on 06/13/2015   . cholecalciferol (VITAMIN D) 1000 UNITS tablet Take 1,000 Units by mouth daily.   .  isosorbide mononitrate (IMDUR) 30 MG 24 hr tablet Take 30 mg by mouth daily.   . metoprolol succinate (TOPROL-XL) 50 MG 24 hr tablet Take 50 mg by mouth daily. Take with or immediately following a meal. Takes 1/2 tablet daily   . mirtazapine (REMERON) 15 MG tablet Take 15 mg by mouth at bedtime.   . nitroGLYCERIN (NITROSTAT) 0.4 MG SL tablet Place 0.4 mg under the tongue every 5 (five) minutes as needed for chest pain. Reported on 06/13/2015 07/16/2015: Medication bottle in date  . nortriptyline (PAMELOR) 25 MG capsule Take 25 mg by mouth at bedtime.   . Omega-3 Krill Oil 300 MG CAPS Take 300 mg by mouth daily.   . polyethylene glycol (MIRALAX / GLYCOLAX) packet Take 17 g by mouth daily.   . Red Yeast Rice 600 MG CAPS Take 600 mg by mouth daily. Reported on 06/13/2015   . Saw Palmetto, Serenoa repens, (SAW PALMETTO PO) Take 1 tablet by mouth daily.   Marland Kitchen atorvastatin (LIPITOR) 80 MG tablet Take 80 mg by mouth daily. Reported on 10/01/2015 06/13/2015: Not taking  . carvedilol (COREG) 6.25 MG tablet Take 6.25 mg by mouth 2 (two) times daily with a meal. Reported on 09/03/2015 08/06/2015: Not taking  . clopidogrel (PLAVIX) 75 MG tablet Take 75 mg by mouth daily. Reported on 10/01/2015 06/13/2015: Not taking  . Cyanocobalamin (VITAMIN B 12 PO) Take 1 tablet by mouth at bedtime. Reported on 10/01/2015   . famotidine (PEPCID) 10 MG tablet Take 10 mg by mouth 2 (two) times daily. Reported on 10/01/2015 06/13/2015: Not  taking  . gabapentin (NEURONTIN) 600 MG tablet Take 600 mg by mouth 3 (three) times daily. Reported on 10/01/2015 06/13/2015: Not taking  . levofloxacin (LEVAQUIN) 500 MG tablet Take 500 mg by mouth daily. Reported on 10/01/2015   . lisinopril (PRINIVIL,ZESTRIL) 10 MG tablet Take 10 mg by mouth daily. Reported on 10/01/2015 06/13/2015: Not taking  . naproxen sodium (ANAPROX) 220 MG tablet Take 220 mg by mouth 2 (two) times daily with a meal. Reported on 10/01/2015 06/13/2015: Not taking   No  facility-administered encounter medications on file as of 10/01/2015.    Functional Status:   In your present state of health, do you have any difficulty performing the following activities: 08/06/2015 06/13/2015  Hearing? N Y  Vision? N N  Difficulty concentrating or making decisions? N Y  Walking or climbing stairs? Y Y  Dressing or bathing? N N  Doing errands, shopping? Y N  Preparing Food and eating ? N N  Using the Toilet? N N  In the past six months, have you accidently leaked urine? N N  Do you have problems with loss of bowel control? N N  Managing your Medications? Y N  Managing your Finances? N N  Housekeeping or managing your Housekeeping? N Y    Fall/Depression Screening:    PHQ 2/9 Scores 08/06/2015 06/13/2015 05/31/2015  PHQ - 2 Score 0 1 1    Assessment:  Routine home visit  Falls Risk Patient remains high risk for fall, uses his walker when walking outside on road, he has declined using his cane walker inside. Reviewed fall prevention strategies.  Hypertension Family checking blood presssue at least weekly, reading have been within patient normals per report, denies dizziness, reports taking medication as directed with family assistance.  Mr.Mcquade has met care management goals, denies new concerns at this time for continued care management visit, discussed case closure, and provided [facility] contact information for future needs that arise.  Patient has consistent MD follow up, no hospital admissions Decreased falls, tolerance of daily  activity . Supportive family, I have encouraged patient family to notify of MD of concerns for cough or any new symptoms     Plan:  Will Close Case per protocol, notify CMA, send MD and patient  case closure letter.   Joylene Draft, RN, [place] Management [phone]- Mobile [phone]- Toll Free Main Office

## 2015-11-01 DIAGNOSIS — W19XXXA Unspecified fall, initial encounter: Secondary | ICD-10-CM | POA: Diagnosis not present

## 2015-11-01 DIAGNOSIS — Y92099 Unspecified place in other non-institutional residence as the place of occurrence of the external cause: Secondary | ICD-10-CM | POA: Diagnosis not present

## 2015-11-01 DIAGNOSIS — R269 Unspecified abnormalities of gait and mobility: Secondary | ICD-10-CM | POA: Diagnosis not present

## 2015-11-01 DIAGNOSIS — S41112A Laceration without foreign body of left upper arm, initial encounter: Secondary | ICD-10-CM | POA: Diagnosis not present

## 2015-11-21 DIAGNOSIS — R634 Abnormal weight loss: Secondary | ICD-10-CM | POA: Diagnosis not present

## 2015-11-21 DIAGNOSIS — F329 Major depressive disorder, single episode, unspecified: Secondary | ICD-10-CM | POA: Diagnosis not present

## 2015-11-21 DIAGNOSIS — R531 Weakness: Secondary | ICD-10-CM | POA: Diagnosis not present

## 2015-11-21 DIAGNOSIS — I10 Essential (primary) hypertension: Secondary | ICD-10-CM | POA: Diagnosis not present

## 2015-11-21 DIAGNOSIS — Z23 Encounter for immunization: Secondary | ICD-10-CM | POA: Diagnosis not present

## 2015-11-21 DIAGNOSIS — E1140 Type 2 diabetes mellitus with diabetic neuropathy, unspecified: Secondary | ICD-10-CM | POA: Diagnosis not present

## 2015-11-21 DIAGNOSIS — M160 Bilateral primary osteoarthritis of hip: Secondary | ICD-10-CM | POA: Diagnosis not present

## 2015-11-21 DIAGNOSIS — G629 Polyneuropathy, unspecified: Secondary | ICD-10-CM | POA: Diagnosis not present

## 2015-11-21 DIAGNOSIS — I2510 Atherosclerotic heart disease of native coronary artery without angina pectoris: Secondary | ICD-10-CM | POA: Diagnosis not present

## 2015-11-21 DIAGNOSIS — R269 Unspecified abnormalities of gait and mobility: Secondary | ICD-10-CM | POA: Diagnosis not present

## 2016-01-29 ENCOUNTER — Other Ambulatory Visit: Payer: Self-pay | Admitting: Internal Medicine

## 2016-01-29 ENCOUNTER — Ambulatory Visit
Admission: RE | Admit: 2016-01-29 | Discharge: 2016-01-29 | Disposition: A | Discharge status: Home / Self Care | Payer: Medicare Other | Source: Ambulatory Visit | Attending: Internal Medicine | Admitting: Internal Medicine

## 2016-01-29 DIAGNOSIS — R05 Cough: Secondary | ICD-10-CM | POA: Diagnosis not present

## 2016-01-29 DIAGNOSIS — J449 Chronic obstructive pulmonary disease, unspecified: Secondary | ICD-10-CM | POA: Diagnosis not present

## 2016-01-29 DIAGNOSIS — J181 Lobar pneumonia, unspecified organism: Secondary | ICD-10-CM | POA: Diagnosis not present

## 2016-01-29 DIAGNOSIS — J209 Acute bronchitis, unspecified: Secondary | ICD-10-CM

## 2016-02-12 DIAGNOSIS — F329 Major depressive disorder, single episode, unspecified: Secondary | ICD-10-CM | POA: Diagnosis not present

## 2016-02-12 DIAGNOSIS — K5900 Constipation, unspecified: Secondary | ICD-10-CM | POA: Diagnosis not present

## 2016-02-12 DIAGNOSIS — J181 Lobar pneumonia, unspecified organism: Secondary | ICD-10-CM | POA: Diagnosis not present

## 2016-02-12 DIAGNOSIS — J449 Chronic obstructive pulmonary disease, unspecified: Secondary | ICD-10-CM | POA: Diagnosis not present

## 2016-04-23 DIAGNOSIS — H524 Presbyopia: Secondary | ICD-10-CM | POA: Diagnosis not present

## 2016-04-23 DIAGNOSIS — Z961 Presence of intraocular lens: Secondary | ICD-10-CM | POA: Diagnosis not present

## 2016-04-23 DIAGNOSIS — H04123 Dry eye syndrome of bilateral lacrimal glands: Secondary | ICD-10-CM | POA: Diagnosis not present

## 2016-05-08 ENCOUNTER — Other Ambulatory Visit: Payer: Self-pay | Admitting: Internal Medicine

## 2016-05-08 ENCOUNTER — Ambulatory Visit
Admission: RE | Admit: 2016-05-08 | Discharge: 2016-05-08 | Disposition: A | Discharge status: Home / Self Care | Payer: Medicare Other | Source: Ambulatory Visit | Attending: Internal Medicine | Admitting: Internal Medicine

## 2016-05-08 DIAGNOSIS — R05 Cough: Secondary | ICD-10-CM

## 2016-05-08 DIAGNOSIS — J219 Acute bronchiolitis, unspecified: Secondary | ICD-10-CM | POA: Diagnosis not present

## 2016-05-08 DIAGNOSIS — R059 Cough, unspecified: Secondary | ICD-10-CM

## 2016-05-08 DIAGNOSIS — R0602 Shortness of breath: Secondary | ICD-10-CM | POA: Diagnosis not present

## 2016-05-08 DIAGNOSIS — R000 Tachycardia, unspecified: Secondary | ICD-10-CM | POA: Diagnosis not present

## 2016-09-12 DIAGNOSIS — M160 Bilateral primary osteoarthritis of hip: Secondary | ICD-10-CM | POA: Diagnosis not present

## 2016-09-12 DIAGNOSIS — J449 Chronic obstructive pulmonary disease, unspecified: Secondary | ICD-10-CM | POA: Diagnosis not present

## 2016-09-12 DIAGNOSIS — E119 Type 2 diabetes mellitus without complications: Secondary | ICD-10-CM | POA: Diagnosis not present

## 2016-09-12 DIAGNOSIS — F322 Major depressive disorder, single episode, severe without psychotic features: Secondary | ICD-10-CM | POA: Diagnosis not present

## 2016-09-12 DIAGNOSIS — I2510 Atherosclerotic heart disease of native coronary artery without angina pectoris: Secondary | ICD-10-CM | POA: Diagnosis not present

## 2016-09-12 DIAGNOSIS — N400 Enlarged prostate without lower urinary tract symptoms: Secondary | ICD-10-CM | POA: Diagnosis not present

## 2016-09-12 DIAGNOSIS — F329 Major depressive disorder, single episode, unspecified: Secondary | ICD-10-CM | POA: Diagnosis not present

## 2016-09-12 DIAGNOSIS — E1140 Type 2 diabetes mellitus with diabetic neuropathy, unspecified: Secondary | ICD-10-CM | POA: Diagnosis not present

## 2016-09-12 DIAGNOSIS — I10 Essential (primary) hypertension: Secondary | ICD-10-CM | POA: Diagnosis not present

## 2016-09-18 DIAGNOSIS — I10 Essential (primary) hypertension: Secondary | ICD-10-CM | POA: Diagnosis not present

## 2016-09-18 DIAGNOSIS — R269 Unspecified abnormalities of gait and mobility: Secondary | ICD-10-CM | POA: Diagnosis not present

## 2016-09-18 DIAGNOSIS — K5900 Constipation, unspecified: Secondary | ICD-10-CM | POA: Diagnosis not present

## 2016-09-18 DIAGNOSIS — Z0000 Encounter for general adult medical examination without abnormal findings: Secondary | ICD-10-CM | POA: Diagnosis not present

## 2016-09-18 DIAGNOSIS — E1140 Type 2 diabetes mellitus with diabetic neuropathy, unspecified: Secondary | ICD-10-CM | POA: Diagnosis not present

## 2016-09-18 DIAGNOSIS — R531 Weakness: Secondary | ICD-10-CM | POA: Diagnosis not present

## 2016-09-18 DIAGNOSIS — Z79899 Other long term (current) drug therapy: Secondary | ICD-10-CM | POA: Diagnosis not present

## 2016-09-18 DIAGNOSIS — J449 Chronic obstructive pulmonary disease, unspecified: Secondary | ICD-10-CM | POA: Diagnosis not present

## 2016-09-18 DIAGNOSIS — G629 Polyneuropathy, unspecified: Secondary | ICD-10-CM | POA: Diagnosis not present

## 2016-09-18 DIAGNOSIS — F329 Major depressive disorder, single episode, unspecified: Secondary | ICD-10-CM | POA: Diagnosis not present

## 2016-09-18 DIAGNOSIS — M160 Bilateral primary osteoarthritis of hip: Secondary | ICD-10-CM | POA: Diagnosis not present

## 2016-09-18 DIAGNOSIS — I2510 Atherosclerotic heart disease of native coronary artery without angina pectoris: Secondary | ICD-10-CM | POA: Diagnosis not present

## 2016-09-18 DIAGNOSIS — Z1389 Encounter for screening for other disorder: Secondary | ICD-10-CM | POA: Diagnosis not present

## 2016-09-18 DIAGNOSIS — F322 Major depressive disorder, single episode, severe without psychotic features: Secondary | ICD-10-CM | POA: Diagnosis not present

## 2016-09-26 DIAGNOSIS — I25118 Atherosclerotic heart disease of native coronary artery with other forms of angina pectoris: Secondary | ICD-10-CM | POA: Diagnosis not present

## 2016-09-26 DIAGNOSIS — I10 Essential (primary) hypertension: Secondary | ICD-10-CM | POA: Diagnosis not present

## 2016-09-26 DIAGNOSIS — R0602 Shortness of breath: Secondary | ICD-10-CM | POA: Diagnosis not present

## 2016-10-06 DIAGNOSIS — R531 Weakness: Secondary | ICD-10-CM | POA: Diagnosis not present

## 2016-10-06 DIAGNOSIS — R0602 Shortness of breath: Secondary | ICD-10-CM | POA: Diagnosis not present

## 2016-10-06 DIAGNOSIS — I10 Essential (primary) hypertension: Secondary | ICD-10-CM | POA: Diagnosis not present

## 2016-10-06 DIAGNOSIS — E1349 Other specified diabetes mellitus with other diabetic neurological complication: Secondary | ICD-10-CM | POA: Diagnosis not present

## 2016-10-06 DIAGNOSIS — R269 Unspecified abnormalities of gait and mobility: Secondary | ICD-10-CM | POA: Diagnosis not present

## 2016-10-06 DIAGNOSIS — R0600 Dyspnea, unspecified: Secondary | ICD-10-CM | POA: Diagnosis not present

## 2016-10-06 DIAGNOSIS — I2510 Atherosclerotic heart disease of native coronary artery without angina pectoris: Secondary | ICD-10-CM | POA: Diagnosis not present

## 2016-10-24 DIAGNOSIS — M6289 Other specified disorders of muscle: Secondary | ICD-10-CM | POA: Diagnosis not present

## 2016-10-24 DIAGNOSIS — K5900 Constipation, unspecified: Secondary | ICD-10-CM | POA: Diagnosis not present

## 2016-10-24 DIAGNOSIS — I350 Nonrheumatic aortic (valve) stenosis: Secondary | ICD-10-CM | POA: Diagnosis not present

## 2016-10-24 DIAGNOSIS — R0602 Shortness of breath: Secondary | ICD-10-CM | POA: Diagnosis not present

## 2016-10-24 DIAGNOSIS — I25118 Atherosclerotic heart disease of native coronary artery with other forms of angina pectoris: Secondary | ICD-10-CM | POA: Diagnosis not present

## 2016-10-31 DIAGNOSIS — M6289 Other specified disorders of muscle: Secondary | ICD-10-CM | POA: Diagnosis not present

## 2016-10-31 DIAGNOSIS — R531 Weakness: Secondary | ICD-10-CM | POA: Diagnosis not present

## 2016-10-31 DIAGNOSIS — I70209 Unspecified atherosclerosis of native arteries of extremities, unspecified extremity: Secondary | ICD-10-CM | POA: Diagnosis not present

## 2016-12-19 DIAGNOSIS — Z23 Encounter for immunization: Secondary | ICD-10-CM | POA: Diagnosis not present

## 2017-02-06 DIAGNOSIS — I2510 Atherosclerotic heart disease of native coronary artery without angina pectoris: Secondary | ICD-10-CM | POA: Diagnosis not present

## 2017-02-06 DIAGNOSIS — M6289 Other specified disorders of muscle: Secondary | ICD-10-CM | POA: Diagnosis not present

## 2017-02-06 DIAGNOSIS — R0602 Shortness of breath: Secondary | ICD-10-CM | POA: Diagnosis not present

## 2017-02-06 DIAGNOSIS — I10 Essential (primary) hypertension: Secondary | ICD-10-CM | POA: Diagnosis not present

## 2017-02-06 DIAGNOSIS — I350 Nonrheumatic aortic (valve) stenosis: Secondary | ICD-10-CM | POA: Diagnosis not present

## 2017-02-06 DIAGNOSIS — I25118 Atherosclerotic heart disease of native coronary artery with other forms of angina pectoris: Secondary | ICD-10-CM | POA: Diagnosis not present

## 2017-02-22 DIAGNOSIS — Z79899 Other long term (current) drug therapy: Secondary | ICD-10-CM | POA: Diagnosis not present

## 2017-02-22 DIAGNOSIS — I2510 Atherosclerotic heart disease of native coronary artery without angina pectoris: Secondary | ICD-10-CM | POA: Diagnosis not present

## 2017-02-22 DIAGNOSIS — K5641 Fecal impaction: Secondary | ICD-10-CM | POA: Diagnosis not present

## 2017-02-22 DIAGNOSIS — I10 Essential (primary) hypertension: Secondary | ICD-10-CM | POA: Diagnosis not present

## 2017-02-22 DIAGNOSIS — Z885 Allergy status to narcotic agent status: Secondary | ICD-10-CM | POA: Diagnosis not present

## 2017-02-22 DIAGNOSIS — Z87891 Personal history of nicotine dependence: Secondary | ICD-10-CM | POA: Diagnosis not present

## 2017-02-22 DIAGNOSIS — Z955 Presence of coronary angioplasty implant and graft: Secondary | ICD-10-CM | POA: Diagnosis not present

## 2017-02-22 DIAGNOSIS — Z7982 Long term (current) use of aspirin: Secondary | ICD-10-CM | POA: Diagnosis not present

## 2017-02-22 DIAGNOSIS — I252 Old myocardial infarction: Secondary | ICD-10-CM | POA: Diagnosis not present

## 2017-03-18 DIAGNOSIS — E1140 Type 2 diabetes mellitus with diabetic neuropathy, unspecified: Secondary | ICD-10-CM | POA: Diagnosis not present

## 2017-03-18 DIAGNOSIS — F322 Major depressive disorder, single episode, severe without psychotic features: Secondary | ICD-10-CM | POA: Diagnosis not present

## 2017-03-18 DIAGNOSIS — R531 Weakness: Secondary | ICD-10-CM | POA: Diagnosis not present

## 2017-03-18 DIAGNOSIS — J449 Chronic obstructive pulmonary disease, unspecified: Secondary | ICD-10-CM | POA: Diagnosis not present

## 2017-03-18 DIAGNOSIS — G629 Polyneuropathy, unspecified: Secondary | ICD-10-CM | POA: Diagnosis not present

## 2017-03-18 DIAGNOSIS — M160 Bilateral primary osteoarthritis of hip: Secondary | ICD-10-CM | POA: Diagnosis not present

## 2017-03-18 DIAGNOSIS — I2510 Atherosclerotic heart disease of native coronary artery without angina pectoris: Secondary | ICD-10-CM | POA: Diagnosis not present

## 2017-03-18 DIAGNOSIS — R269 Unspecified abnormalities of gait and mobility: Secondary | ICD-10-CM | POA: Diagnosis not present

## 2017-03-18 DIAGNOSIS — Z79899 Other long term (current) drug therapy: Secondary | ICD-10-CM | POA: Diagnosis not present

## 2017-03-18 DIAGNOSIS — I10 Essential (primary) hypertension: Secondary | ICD-10-CM | POA: Diagnosis not present

## 2017-04-29 DIAGNOSIS — R7303 Prediabetes: Secondary | ICD-10-CM | POA: Diagnosis not present

## 2017-09-11 DIAGNOSIS — R0602 Shortness of breath: Secondary | ICD-10-CM | POA: Diagnosis not present

## 2017-09-11 DIAGNOSIS — R269 Unspecified abnormalities of gait and mobility: Secondary | ICD-10-CM | POA: Diagnosis not present

## 2017-09-11 DIAGNOSIS — I25118 Atherosclerotic heart disease of native coronary artery with other forms of angina pectoris: Secondary | ICD-10-CM | POA: Diagnosis not present

## 2017-09-11 DIAGNOSIS — M6289 Other specified disorders of muscle: Secondary | ICD-10-CM | POA: Diagnosis not present

## 2017-09-11 DIAGNOSIS — K5900 Constipation, unspecified: Secondary | ICD-10-CM | POA: Diagnosis not present

## 2017-09-11 DIAGNOSIS — I350 Nonrheumatic aortic (valve) stenosis: Secondary | ICD-10-CM | POA: Diagnosis not present

## 2017-09-11 DIAGNOSIS — I2510 Atherosclerotic heart disease of native coronary artery without angina pectoris: Secondary | ICD-10-CM | POA: Diagnosis not present

## 2017-11-17 DIAGNOSIS — I9589 Other hypotension: Secondary | ICD-10-CM | POA: Diagnosis not present

## 2017-11-17 DIAGNOSIS — I252 Old myocardial infarction: Secondary | ICD-10-CM | POA: Diagnosis not present

## 2017-11-17 DIAGNOSIS — R531 Weakness: Secondary | ICD-10-CM | POA: Diagnosis not present

## 2017-11-17 DIAGNOSIS — Z955 Presence of coronary angioplasty implant and graft: Secondary | ICD-10-CM | POA: Diagnosis not present

## 2017-11-17 DIAGNOSIS — R55 Syncope and collapse: Secondary | ICD-10-CM | POA: Diagnosis not present

## 2017-11-17 DIAGNOSIS — Z87891 Personal history of nicotine dependence: Secondary | ICD-10-CM | POA: Diagnosis not present

## 2017-11-17 DIAGNOSIS — Z79899 Other long term (current) drug therapy: Secondary | ICD-10-CM | POA: Diagnosis not present

## 2017-11-17 DIAGNOSIS — I2510 Atherosclerotic heart disease of native coronary artery without angina pectoris: Secondary | ICD-10-CM | POA: Diagnosis not present

## 2017-11-17 DIAGNOSIS — I4430 Unspecified atrioventricular block: Secondary | ICD-10-CM | POA: Diagnosis not present

## 2017-11-17 DIAGNOSIS — I10 Essential (primary) hypertension: Secondary | ICD-10-CM | POA: Diagnosis not present

## 2017-11-17 DIAGNOSIS — Z7982 Long term (current) use of aspirin: Secondary | ICD-10-CM | POA: Diagnosis not present

## 2017-11-17 DIAGNOSIS — Z885 Allergy status to narcotic agent status: Secondary | ICD-10-CM | POA: Diagnosis not present

## 2017-11-17 DIAGNOSIS — E861 Hypovolemia: Secondary | ICD-10-CM | POA: Diagnosis not present

## 2018-03-26 DIAGNOSIS — Z125 Encounter for screening for malignant neoplasm of prostate: Secondary | ICD-10-CM | POA: Diagnosis not present

## 2018-03-26 DIAGNOSIS — I10 Essential (primary) hypertension: Secondary | ICD-10-CM | POA: Diagnosis not present

## 2018-03-26 DIAGNOSIS — J449 Chronic obstructive pulmonary disease, unspecified: Secondary | ICD-10-CM | POA: Diagnosis not present

## 2018-03-26 DIAGNOSIS — Z79899 Other long term (current) drug therapy: Secondary | ICD-10-CM | POA: Diagnosis not present

## 2018-03-26 DIAGNOSIS — Z0001 Encounter for general adult medical examination with abnormal findings: Secondary | ICD-10-CM | POA: Diagnosis not present

## 2018-03-26 DIAGNOSIS — G629 Polyneuropathy, unspecified: Secondary | ICD-10-CM | POA: Diagnosis not present

## 2018-03-26 DIAGNOSIS — I2510 Atherosclerotic heart disease of native coronary artery without angina pectoris: Secondary | ICD-10-CM | POA: Diagnosis not present

## 2018-03-26 DIAGNOSIS — E1140 Type 2 diabetes mellitus with diabetic neuropathy, unspecified: Secondary | ICD-10-CM | POA: Diagnosis not present

## 2018-03-26 DIAGNOSIS — I951 Orthostatic hypotension: Secondary | ICD-10-CM | POA: Diagnosis not present

## 2018-03-26 DIAGNOSIS — Z1389 Encounter for screening for other disorder: Secondary | ICD-10-CM | POA: Diagnosis not present

## 2018-03-26 DIAGNOSIS — R531 Weakness: Secondary | ICD-10-CM | POA: Diagnosis not present

## 2018-03-26 DIAGNOSIS — F322 Major depressive disorder, single episode, severe without psychotic features: Secondary | ICD-10-CM | POA: Diagnosis not present

## 2018-04-02 DIAGNOSIS — I2510 Atherosclerotic heart disease of native coronary artery without angina pectoris: Secondary | ICD-10-CM | POA: Diagnosis not present

## 2018-04-02 DIAGNOSIS — I10 Essential (primary) hypertension: Secondary | ICD-10-CM | POA: Diagnosis not present

## 2018-04-02 DIAGNOSIS — I350 Nonrheumatic aortic (valve) stenosis: Secondary | ICD-10-CM | POA: Diagnosis not present

## 2018-04-27 DIAGNOSIS — E1140 Type 2 diabetes mellitus with diabetic neuropathy, unspecified: Secondary | ICD-10-CM | POA: Diagnosis not present

## 2018-04-27 DIAGNOSIS — R441 Visual hallucinations: Secondary | ICD-10-CM | POA: Diagnosis not present

## 2018-04-27 DIAGNOSIS — J449 Chronic obstructive pulmonary disease, unspecified: Secondary | ICD-10-CM | POA: Diagnosis not present

## 2018-04-27 DIAGNOSIS — Z79899 Other long term (current) drug therapy: Secondary | ICD-10-CM | POA: Diagnosis not present

## 2018-04-27 DIAGNOSIS — F322 Major depressive disorder, single episode, severe without psychotic features: Secondary | ICD-10-CM | POA: Diagnosis not present

## 2018-04-27 DIAGNOSIS — M160 Bilateral primary osteoarthritis of hip: Secondary | ICD-10-CM | POA: Diagnosis not present

## 2018-04-27 DIAGNOSIS — R269 Unspecified abnormalities of gait and mobility: Secondary | ICD-10-CM | POA: Diagnosis not present

## 2018-04-27 DIAGNOSIS — I2510 Atherosclerotic heart disease of native coronary artery without angina pectoris: Secondary | ICD-10-CM | POA: Diagnosis not present

## 2018-04-27 DIAGNOSIS — R531 Weakness: Secondary | ICD-10-CM | POA: Diagnosis not present

## 2018-04-27 DIAGNOSIS — I951 Orthostatic hypotension: Secondary | ICD-10-CM | POA: Diagnosis not present

## 2018-04-27 DIAGNOSIS — I10 Essential (primary) hypertension: Secondary | ICD-10-CM | POA: Diagnosis not present

## 2018-04-27 DIAGNOSIS — G629 Polyneuropathy, unspecified: Secondary | ICD-10-CM | POA: Diagnosis not present

## 2018-05-27 DIAGNOSIS — R531 Weakness: Secondary | ICD-10-CM | POA: Diagnosis not present

## 2018-05-27 DIAGNOSIS — I951 Orthostatic hypotension: Secondary | ICD-10-CM | POA: Diagnosis not present

## 2018-05-27 DIAGNOSIS — F322 Major depressive disorder, single episode, severe without psychotic features: Secondary | ICD-10-CM | POA: Diagnosis not present

## 2018-05-27 DIAGNOSIS — R441 Visual hallucinations: Secondary | ICD-10-CM | POA: Diagnosis not present

## 2018-05-27 DIAGNOSIS — I2510 Atherosclerotic heart disease of native coronary artery without angina pectoris: Secondary | ICD-10-CM | POA: Diagnosis not present

## 2018-05-27 DIAGNOSIS — G629 Polyneuropathy, unspecified: Secondary | ICD-10-CM | POA: Diagnosis not present

## 2018-05-27 DIAGNOSIS — G4700 Insomnia, unspecified: Secondary | ICD-10-CM | POA: Diagnosis not present

## 2018-06-11 DIAGNOSIS — H43393 Other vitreous opacities, bilateral: Secondary | ICD-10-CM | POA: Diagnosis not present

## 2018-07-06 DIAGNOSIS — I252 Old myocardial infarction: Secondary | ICD-10-CM | POA: Diagnosis not present

## 2018-07-06 DIAGNOSIS — Z885 Allergy status to narcotic agent status: Secondary | ICD-10-CM | POA: Diagnosis not present

## 2018-07-06 DIAGNOSIS — I25119 Atherosclerotic heart disease of native coronary artery with unspecified angina pectoris: Secondary | ICD-10-CM | POA: Diagnosis not present

## 2018-07-06 DIAGNOSIS — R109 Unspecified abdominal pain: Secondary | ICD-10-CM | POA: Diagnosis not present

## 2018-07-06 DIAGNOSIS — Z955 Presence of coronary angioplasty implant and graft: Secondary | ICD-10-CM | POA: Diagnosis not present

## 2018-07-06 DIAGNOSIS — R079 Chest pain, unspecified: Secondary | ICD-10-CM | POA: Diagnosis not present

## 2018-07-06 DIAGNOSIS — Z7982 Long term (current) use of aspirin: Secondary | ICD-10-CM | POA: Diagnosis not present

## 2018-07-06 DIAGNOSIS — Z79899 Other long term (current) drug therapy: Secondary | ICD-10-CM | POA: Diagnosis not present

## 2018-07-06 DIAGNOSIS — Z87891 Personal history of nicotine dependence: Secondary | ICD-10-CM | POA: Diagnosis not present

## 2018-07-06 DIAGNOSIS — I10 Essential (primary) hypertension: Secondary | ICD-10-CM | POA: Diagnosis not present

## 2018-07-07 DIAGNOSIS — R109 Unspecified abdominal pain: Secondary | ICD-10-CM | POA: Diagnosis not present

## 2018-07-15 DIAGNOSIS — R441 Visual hallucinations: Secondary | ICD-10-CM | POA: Diagnosis not present

## 2018-07-15 DIAGNOSIS — F322 Major depressive disorder, single episode, severe without psychotic features: Secondary | ICD-10-CM | POA: Diagnosis not present

## 2018-07-15 DIAGNOSIS — I2510 Atherosclerotic heart disease of native coronary artery without angina pectoris: Secondary | ICD-10-CM | POA: Diagnosis not present

## 2018-07-15 DIAGNOSIS — R531 Weakness: Secondary | ICD-10-CM | POA: Diagnosis not present

## 2018-07-15 DIAGNOSIS — G4700 Insomnia, unspecified: Secondary | ICD-10-CM | POA: Diagnosis not present

## 2018-07-15 DIAGNOSIS — I951 Orthostatic hypotension: Secondary | ICD-10-CM | POA: Diagnosis not present

## 2018-07-15 DIAGNOSIS — G629 Polyneuropathy, unspecified: Secondary | ICD-10-CM | POA: Diagnosis not present

## 2018-07-21 DIAGNOSIS — R0601 Orthopnea: Secondary | ICD-10-CM | POA: Diagnosis not present

## 2018-07-21 DIAGNOSIS — R531 Weakness: Secondary | ICD-10-CM | POA: Diagnosis not present

## 2018-07-21 DIAGNOSIS — I25118 Atherosclerotic heart disease of native coronary artery with other forms of angina pectoris: Secondary | ICD-10-CM | POA: Diagnosis not present

## 2018-07-21 DIAGNOSIS — E785 Hyperlipidemia, unspecified: Secondary | ICD-10-CM | POA: Diagnosis not present

## 2018-07-21 DIAGNOSIS — L89152 Pressure ulcer of sacral region, stage 2: Secondary | ICD-10-CM | POA: Diagnosis not present

## 2018-07-21 DIAGNOSIS — Z87891 Personal history of nicotine dependence: Secondary | ICD-10-CM | POA: Diagnosis not present

## 2018-07-21 DIAGNOSIS — I509 Heart failure, unspecified: Secondary | ICD-10-CM | POA: Diagnosis not present

## 2018-07-21 DIAGNOSIS — I10 Essential (primary) hypertension: Secondary | ICD-10-CM | POA: Diagnosis not present

## 2018-09-28 DIAGNOSIS — G629 Polyneuropathy, unspecified: Secondary | ICD-10-CM | POA: Diagnosis not present

## 2018-09-28 DIAGNOSIS — L89152 Pressure ulcer of sacral region, stage 2: Secondary | ICD-10-CM | POA: Diagnosis not present

## 2018-09-28 DIAGNOSIS — I2510 Atherosclerotic heart disease of native coronary artery without angina pectoris: Secondary | ICD-10-CM | POA: Diagnosis not present

## 2018-09-28 DIAGNOSIS — R531 Weakness: Secondary | ICD-10-CM | POA: Diagnosis not present

## 2018-09-28 DIAGNOSIS — F322 Major depressive disorder, single episode, severe without psychotic features: Secondary | ICD-10-CM | POA: Diagnosis not present

## 2018-09-28 DIAGNOSIS — I509 Heart failure, unspecified: Secondary | ICD-10-CM | POA: Diagnosis not present

## 2018-09-28 DIAGNOSIS — G4700 Insomnia, unspecified: Secondary | ICD-10-CM | POA: Diagnosis not present

## 2018-09-28 DIAGNOSIS — R0601 Orthopnea: Secondary | ICD-10-CM | POA: Diagnosis not present

## 2018-09-28 DIAGNOSIS — I951 Orthostatic hypotension: Secondary | ICD-10-CM | POA: Diagnosis not present

## 2018-09-28 DIAGNOSIS — R441 Visual hallucinations: Secondary | ICD-10-CM | POA: Diagnosis not present

## 2018-09-28 DIAGNOSIS — R110 Nausea: Secondary | ICD-10-CM | POA: Diagnosis not present

## 2018-09-29 DIAGNOSIS — R110 Nausea: Secondary | ICD-10-CM | POA: Diagnosis not present

## 2018-09-29 DIAGNOSIS — F322 Major depressive disorder, single episode, severe without psychotic features: Secondary | ICD-10-CM | POA: Diagnosis not present

## 2018-09-29 DIAGNOSIS — I509 Heart failure, unspecified: Secondary | ICD-10-CM | POA: Diagnosis not present

## 2018-09-29 DIAGNOSIS — E538 Deficiency of other specified B group vitamins: Secondary | ICD-10-CM | POA: Diagnosis not present

## 2018-09-29 DIAGNOSIS — G629 Polyneuropathy, unspecified: Secondary | ICD-10-CM | POA: Diagnosis not present

## 2018-11-12 DIAGNOSIS — I25118 Atherosclerotic heart disease of native coronary artery with other forms of angina pectoris: Secondary | ICD-10-CM | POA: Diagnosis not present

## 2018-11-12 DIAGNOSIS — I10 Essential (primary) hypertension: Secondary | ICD-10-CM | POA: Diagnosis not present

## 2018-11-12 DIAGNOSIS — I2510 Atherosclerotic heart disease of native coronary artery without angina pectoris: Secondary | ICD-10-CM | POA: Diagnosis not present

## 2019-03-31 DIAGNOSIS — D81818 Other biotin-dependent carboxylase deficiency: Secondary | ICD-10-CM | POA: Diagnosis not present

## 2019-03-31 DIAGNOSIS — F322 Major depressive disorder, single episode, severe without psychotic features: Secondary | ICD-10-CM | POA: Diagnosis not present

## 2019-03-31 DIAGNOSIS — E559 Vitamin D deficiency, unspecified: Secondary | ICD-10-CM | POA: Diagnosis not present

## 2019-03-31 DIAGNOSIS — R350 Frequency of micturition: Secondary | ICD-10-CM | POA: Diagnosis not present

## 2019-03-31 DIAGNOSIS — R531 Weakness: Secondary | ICD-10-CM | POA: Diagnosis not present

## 2019-03-31 DIAGNOSIS — I2510 Atherosclerotic heart disease of native coronary artery without angina pectoris: Secondary | ICD-10-CM | POA: Diagnosis not present

## 2019-03-31 DIAGNOSIS — R634 Abnormal weight loss: Secondary | ICD-10-CM | POA: Diagnosis not present

## 2019-03-31 DIAGNOSIS — J449 Chronic obstructive pulmonary disease, unspecified: Secondary | ICD-10-CM | POA: Diagnosis not present

## 2019-03-31 DIAGNOSIS — I509 Heart failure, unspecified: Secondary | ICD-10-CM | POA: Diagnosis not present

## 2019-03-31 DIAGNOSIS — G4700 Insomnia, unspecified: Secondary | ICD-10-CM | POA: Diagnosis not present

## 2019-03-31 DIAGNOSIS — G629 Polyneuropathy, unspecified: Secondary | ICD-10-CM | POA: Diagnosis not present

## 2019-03-31 DIAGNOSIS — R269 Unspecified abnormalities of gait and mobility: Secondary | ICD-10-CM | POA: Diagnosis not present

## 2019-04-22 DIAGNOSIS — Z23 Encounter for immunization: Secondary | ICD-10-CM | POA: Diagnosis not present

## 2019-05-20 DIAGNOSIS — Z23 Encounter for immunization: Secondary | ICD-10-CM | POA: Diagnosis not present

## 2019-06-26 DIAGNOSIS — N281 Cyst of kidney, acquired: Secondary | ICD-10-CM | POA: Diagnosis not present

## 2019-06-26 DIAGNOSIS — N320 Bladder-neck obstruction: Secondary | ICD-10-CM | POA: Diagnosis not present

## 2019-06-26 DIAGNOSIS — I10 Essential (primary) hypertension: Secondary | ICD-10-CM | POA: Diagnosis not present

## 2019-06-26 DIAGNOSIS — K8020 Calculus of gallbladder without cholecystitis without obstruction: Secondary | ICD-10-CM | POA: Diagnosis not present

## 2019-06-26 DIAGNOSIS — D72829 Elevated white blood cell count, unspecified: Secondary | ICD-10-CM | POA: Diagnosis not present

## 2019-06-26 DIAGNOSIS — R072 Precordial pain: Secondary | ICD-10-CM | POA: Diagnosis not present

## 2019-06-26 DIAGNOSIS — I252 Old myocardial infarction: Secondary | ICD-10-CM | POA: Diagnosis not present

## 2019-06-26 DIAGNOSIS — Z885 Allergy status to narcotic agent status: Secondary | ICD-10-CM | POA: Diagnosis not present

## 2019-06-26 DIAGNOSIS — Z87891 Personal history of nicotine dependence: Secondary | ICD-10-CM | POA: Diagnosis not present

## 2019-06-26 DIAGNOSIS — I2510 Atherosclerotic heart disease of native coronary artery without angina pectoris: Secondary | ICD-10-CM | POA: Diagnosis not present

## 2019-06-26 DIAGNOSIS — R079 Chest pain, unspecified: Secondary | ICD-10-CM | POA: Diagnosis not present

## 2019-06-26 DIAGNOSIS — R1013 Epigastric pain: Secondary | ICD-10-CM | POA: Diagnosis not present

## 2019-06-26 DIAGNOSIS — Z7982 Long term (current) use of aspirin: Secondary | ICD-10-CM | POA: Diagnosis not present

## 2019-06-26 DIAGNOSIS — K5730 Diverticulosis of large intestine without perforation or abscess without bleeding: Secondary | ICD-10-CM | POA: Diagnosis not present

## 2019-06-26 DIAGNOSIS — Z955 Presence of coronary angioplasty implant and graft: Secondary | ICD-10-CM | POA: Diagnosis not present

## 2019-06-27 DIAGNOSIS — R109 Unspecified abdominal pain: Secondary | ICD-10-CM | POA: Diagnosis not present

## 2019-06-30 ENCOUNTER — Inpatient Hospital Stay (HOSPITAL_COMMUNITY)
Admission: EM | Admit: 2019-06-30 | Discharge: 2019-07-07 | DRG: 853 | Disposition: A | Discharge status: Home Health | Payer: Medicare Other | Attending: Internal Medicine | Admitting: Internal Medicine

## 2019-06-30 ENCOUNTER — Encounter (HOSPITAL_COMMUNITY): Payer: Self-pay | Admitting: Internal Medicine

## 2019-06-30 ENCOUNTER — Emergency Department (HOSPITAL_COMMUNITY): Payer: Medicare Other

## 2019-06-30 DIAGNOSIS — K8000 Calculus of gallbladder with acute cholecystitis without obstruction: Secondary | ICD-10-CM | POA: Diagnosis present

## 2019-06-30 DIAGNOSIS — E559 Vitamin D deficiency, unspecified: Secondary | ICD-10-CM | POA: Diagnosis present

## 2019-06-30 DIAGNOSIS — Z7902 Long term (current) use of antithrombotics/antiplatelets: Secondary | ICD-10-CM

## 2019-06-30 DIAGNOSIS — Z7982 Long term (current) use of aspirin: Secondary | ICD-10-CM

## 2019-06-30 DIAGNOSIS — Z87891 Personal history of nicotine dependence: Secondary | ICD-10-CM | POA: Diagnosis not present

## 2019-06-30 DIAGNOSIS — R509 Fever, unspecified: Secondary | ICD-10-CM

## 2019-06-30 DIAGNOSIS — N400 Enlarged prostate without lower urinary tract symptoms: Secondary | ICD-10-CM | POA: Diagnosis present

## 2019-06-30 DIAGNOSIS — F329 Major depressive disorder, single episode, unspecified: Secondary | ICD-10-CM | POA: Diagnosis present

## 2019-06-30 DIAGNOSIS — B9689 Other specified bacterial agents as the cause of diseases classified elsewhere: Secondary | ICD-10-CM | POA: Diagnosis not present

## 2019-06-30 DIAGNOSIS — K8010 Calculus of gallbladder with chronic cholecystitis without obstruction: Secondary | ICD-10-CM | POA: Diagnosis not present

## 2019-06-30 DIAGNOSIS — E1142 Type 2 diabetes mellitus with diabetic polyneuropathy: Secondary | ICD-10-CM | POA: Diagnosis present

## 2019-06-30 DIAGNOSIS — K219 Gastro-esophageal reflux disease without esophagitis: Secondary | ICD-10-CM | POA: Diagnosis present

## 2019-06-30 DIAGNOSIS — Z885 Allergy status to narcotic agent status: Secondary | ICD-10-CM

## 2019-06-30 DIAGNOSIS — N179 Acute kidney failure, unspecified: Secondary | ICD-10-CM | POA: Diagnosis present

## 2019-06-30 DIAGNOSIS — G9341 Metabolic encephalopathy: Secondary | ICD-10-CM | POA: Diagnosis present

## 2019-06-30 DIAGNOSIS — Z85820 Personal history of malignant melanoma of skin: Secondary | ICD-10-CM | POA: Diagnosis not present

## 2019-06-30 DIAGNOSIS — R4182 Altered mental status, unspecified: Secondary | ICD-10-CM | POA: Diagnosis not present

## 2019-06-30 DIAGNOSIS — K8020 Calculus of gallbladder without cholecystitis without obstruction: Secondary | ICD-10-CM | POA: Diagnosis present

## 2019-06-30 DIAGNOSIS — K82A1 Gangrene of gallbladder in cholecystitis: Secondary | ICD-10-CM | POA: Diagnosis not present

## 2019-06-30 DIAGNOSIS — I350 Nonrheumatic aortic (valve) stenosis: Secondary | ICD-10-CM | POA: Diagnosis not present

## 2019-06-30 DIAGNOSIS — E785 Hyperlipidemia, unspecified: Secondary | ICD-10-CM | POA: Diagnosis present

## 2019-06-30 DIAGNOSIS — I2510 Atherosclerotic heart disease of native coronary artery without angina pectoris: Secondary | ICD-10-CM | POA: Diagnosis present

## 2019-06-30 DIAGNOSIS — E876 Hypokalemia: Secondary | ICD-10-CM | POA: Diagnosis present

## 2019-06-30 DIAGNOSIS — K819 Cholecystitis, unspecified: Secondary | ICD-10-CM | POA: Diagnosis not present

## 2019-06-30 DIAGNOSIS — A4159 Other Gram-negative sepsis: Principal | ICD-10-CM | POA: Diagnosis present

## 2019-06-30 DIAGNOSIS — G9340 Encephalopathy, unspecified: Secondary | ICD-10-CM | POA: Diagnosis not present

## 2019-06-30 DIAGNOSIS — Z823 Family history of stroke: Secondary | ICD-10-CM

## 2019-06-30 DIAGNOSIS — E872 Acidosis: Secondary | ICD-10-CM | POA: Diagnosis not present

## 2019-06-30 DIAGNOSIS — D649 Anemia, unspecified: Secondary | ICD-10-CM | POA: Diagnosis present

## 2019-06-30 DIAGNOSIS — Z20822 Contact with and (suspected) exposure to covid-19: Secondary | ICD-10-CM | POA: Diagnosis present

## 2019-06-30 DIAGNOSIS — A419 Sepsis, unspecified organism: Secondary | ICD-10-CM | POA: Diagnosis present

## 2019-06-30 DIAGNOSIS — R0689 Other abnormalities of breathing: Secondary | ICD-10-CM | POA: Diagnosis not present

## 2019-06-30 DIAGNOSIS — I10 Essential (primary) hypertension: Secondary | ICD-10-CM | POA: Diagnosis present

## 2019-06-30 DIAGNOSIS — I252 Old myocardial infarction: Secondary | ICD-10-CM

## 2019-06-30 DIAGNOSIS — R9431 Abnormal electrocardiogram [ECG] [EKG]: Secondary | ICD-10-CM | POA: Diagnosis not present

## 2019-06-30 DIAGNOSIS — R7881 Bacteremia: Secondary | ICD-10-CM | POA: Diagnosis present

## 2019-06-30 DIAGNOSIS — R000 Tachycardia, unspecified: Secondary | ICD-10-CM | POA: Diagnosis not present

## 2019-06-30 DIAGNOSIS — R0902 Hypoxemia: Secondary | ICD-10-CM | POA: Diagnosis not present

## 2019-06-30 DIAGNOSIS — Z79899 Other long term (current) drug therapy: Secondary | ICD-10-CM

## 2019-06-30 DIAGNOSIS — M5030 Other cervical disc degeneration, unspecified cervical region: Secondary | ICD-10-CM | POA: Diagnosis present

## 2019-06-30 HISTORY — DX: Bacteremia: R78.81

## 2019-06-30 LAB — CBC WITH DIFFERENTIAL/PLATELET
Abs Immature Granulocytes: 0.05 10*3/uL (ref 0.00–0.07)
Basophils Absolute: 0 10*3/uL (ref 0.0–0.1)
Basophils Relative: 0 %
Eosinophils Absolute: 0 10*3/uL (ref 0.0–0.5)
Eosinophils Relative: 0 %
HCT: 36.3 % — ABNORMAL LOW (ref 39.0–52.0)
Hemoglobin: 12.2 g/dL — ABNORMAL LOW (ref 13.0–17.0)
Immature Granulocytes: 1 %
Lymphocytes Relative: 2 %
Lymphs Abs: 0.2 10*3/uL — ABNORMAL LOW (ref 0.7–4.0)
MCH: 29.8 pg (ref 26.0–34.0)
MCHC: 33.6 g/dL (ref 30.0–36.0)
MCV: 88.5 fL (ref 80.0–100.0)
Monocytes Absolute: 0.2 10*3/uL (ref 0.1–1.0)
Monocytes Relative: 3 %
Neutro Abs: 7.3 10*3/uL (ref 1.7–7.7)
Neutrophils Relative %: 94 %
Platelets: UNDETERMINED 10*3/uL (ref 150–400)
RBC: 4.1 MIL/uL — ABNORMAL LOW (ref 4.22–5.81)
RDW: 13.2 % (ref 11.5–15.5)
WBC: 7.7 10*3/uL (ref 4.0–10.5)
nRBC: 0 % (ref 0.0–0.2)

## 2019-06-30 LAB — URINALYSIS, ROUTINE W REFLEX MICROSCOPIC
Bilirubin Urine: NEGATIVE
Glucose, UA: 50 mg/dL — AB
Ketones, ur: 5 mg/dL — AB
Leukocytes,Ua: NEGATIVE
Nitrite: NEGATIVE
Protein, ur: 100 mg/dL — AB
Specific Gravity, Urine: 1.016 (ref 1.005–1.030)
pH: 5 (ref 5.0–8.0)

## 2019-06-30 LAB — COMPREHENSIVE METABOLIC PANEL
ALT: 28 U/L (ref 0–44)
AST: 44 U/L — ABNORMAL HIGH (ref 15–41)
Albumin: 3.1 g/dL — ABNORMAL LOW (ref 3.5–5.0)
Alkaline Phosphatase: 113 U/L (ref 38–126)
Anion gap: 13 (ref 5–15)
BUN: 38 mg/dL — ABNORMAL HIGH (ref 8–23)
CO2: 21 mmol/L — ABNORMAL LOW (ref 22–32)
Calcium: 8.2 mg/dL — ABNORMAL LOW (ref 8.9–10.3)
Chloride: 98 mmol/L (ref 98–111)
Creatinine, Ser: 1.56 mg/dL — ABNORMAL HIGH (ref 0.61–1.24)
GFR calc Af Amer: 45 mL/min — ABNORMAL LOW (ref >60)
GFR calc non Af Amer: 39 mL/min — ABNORMAL LOW (ref >60)
Glucose, Bld: 169 mg/dL — ABNORMAL HIGH (ref 70–99)
Potassium: 2.9 mmol/L — ABNORMAL LOW (ref 3.5–5.1)
Sodium: 132 mmol/L — ABNORMAL LOW (ref 135–145)
Total Bilirubin: 1.4 mg/dL — ABNORMAL HIGH (ref 0.3–1.2)
Total Protein: 6 g/dL — ABNORMAL LOW (ref 6.5–8.1)

## 2019-06-30 LAB — LACTIC ACID, PLASMA
Lactic Acid, Venous: 2.5 mmol/L (ref 0.5–1.9)
Lactic Acid, Venous: 2.8 mmol/L (ref 0.5–1.9)
Lactic Acid, Venous: 2 mmol/L (ref 0.5–1.9)
Lactic Acid, Venous: 2.7 mmol/L (ref 0.5–1.9)

## 2019-06-30 LAB — APTT: aPTT: 36 seconds (ref 24–36)

## 2019-06-30 LAB — CBG MONITORING, ED: Glucose-Capillary: 156 mg/dL — ABNORMAL HIGH (ref 70–99)

## 2019-06-30 LAB — PROTIME-INR
INR: 1.3 — ABNORMAL HIGH (ref 0.8–1.2)
Prothrombin Time: 15.8 seconds — ABNORMAL HIGH (ref 11.4–15.2)

## 2019-06-30 LAB — MAGNESIUM: Magnesium: 1.6 mg/dL — ABNORMAL LOW (ref 1.7–2.4)

## 2019-06-30 LAB — RESPIRATORY PANEL BY RT PCR (FLU A&B, COVID)
Influenza A by PCR: NEGATIVE
Influenza B by PCR: NEGATIVE
SARS Coronavirus 2 by RT PCR: NEGATIVE

## 2019-06-30 MED ORDER — TIGER BALM EXTRA STRENGTH 11-10 % EX OINT: 1.0000 "application " | TOPICAL_OINTMENT | Freq: Every day | CUTANEOUS | Status: DC | PRN

## 2019-06-30 MED ORDER — POTASSIUM CHLORIDE 20 MEQ PO PACK
40.0000 meq | PACK | Freq: Once | ORAL | Status: AC
  Administered 2019-06-30: 40 meq via ORAL
  Filled 2019-06-30: qty 2

## 2019-06-30 MED ORDER — MELATONIN 3 MG PO TABS
1.5000 mg | ORAL_TABLET | Freq: Every evening | ORAL | Status: DC | PRN
  Administered 2019-07-04 – 2019-07-06 (×2): 1.5 mg via ORAL
  Filled 2019-06-30 (×2): qty 1
  Filled 2019-06-30: qty 0.5

## 2019-06-30 MED ORDER — ONDANSETRON HCL 4 MG PO TABS: 4.0000 mg | ORAL_TABLET | Freq: Four times a day (QID) | ORAL | Status: DC | PRN

## 2019-06-30 MED ORDER — LACTATED RINGERS IV BOLUS
500.0000 mL | Freq: Once | INTRAVENOUS | Status: AC
  Administered 2019-06-30: 500 mL via INTRAVENOUS

## 2019-06-30 MED ORDER — VANCOMYCIN HCL IN DEXTROSE 1-5 GM/200ML-% IV SOLN
1000.0000 mg | Freq: Once | INTRAVENOUS | Status: AC
  Administered 2019-06-30: 1000 mg via INTRAVENOUS
  Filled 2019-06-30: qty 200

## 2019-06-30 MED ORDER — ACETAMINOPHEN 325 MG PO TABS
650.0000 mg | ORAL_TABLET | Freq: Four times a day (QID) | ORAL | Status: DC | PRN
  Administered 2019-07-01 – 2019-07-02 (×2): 650 mg via ORAL
  Filled 2019-06-30 (×2): qty 2

## 2019-06-30 MED ORDER — LACTATED RINGERS IV BOLUS (SEPSIS)
1000.0000 mL | Freq: Once | INTRAVENOUS | Status: AC
  Administered 2019-06-30: 1000 mL via INTRAVENOUS

## 2019-06-30 MED ORDER — OLANZAPINE 5 MG PO TABS
5.0000 mg | ORAL_TABLET | Freq: Every day | ORAL | Status: DC
  Administered 2019-06-30 – 2019-07-06 (×7): 5 mg via ORAL
  Filled 2019-06-30 (×8): qty 1

## 2019-06-30 MED ORDER — VANCOMYCIN HCL 750 MG/150ML IV SOLN
750.0000 mg | INTRAVENOUS | Status: DC
  Filled 2019-06-30: qty 150

## 2019-06-30 MED ORDER — SODIUM CHLORIDE 0.9 % IV SOLN
2.0000 g | Freq: Once | INTRAVENOUS | Status: AC
  Administered 2019-06-30: 2 g via INTRAVENOUS
  Filled 2019-06-30: qty 2

## 2019-06-30 MED ORDER — ACETAMINOPHEN 650 MG RE SUPP: 650.0000 mg | Freq: Four times a day (QID) | RECTAL | Status: DC | PRN

## 2019-06-30 MED ORDER — SODIUM CHLORIDE 0.9% FLUSH
3.0000 mL | Freq: Two times a day (BID) | INTRAVENOUS | Status: DC
  Administered 2019-06-30 – 2019-07-06 (×6): 3 mL via INTRAVENOUS

## 2019-06-30 MED ORDER — VENLAFAXINE HCL ER 37.5 MG PO CP24
37.5000 mg | ORAL_CAPSULE | Freq: Every day | ORAL | Status: DC
  Administered 2019-07-01 – 2019-07-07 (×6): 37.5 mg via ORAL
  Filled 2019-06-30 (×10): qty 1

## 2019-06-30 MED ORDER — POTASSIUM CHLORIDE CRYS ER 20 MEQ PO TBCR
30.0000 meq | EXTENDED_RELEASE_TABLET | ORAL | Status: AC
  Administered 2019-06-30: 30 meq via ORAL
  Filled 2019-06-30: qty 1

## 2019-06-30 MED ORDER — POLYETHYLENE GLYCOL 3350 17 G PO PACK: 17.0000 g | PACK | Freq: Every day | ORAL | Status: DC | PRN

## 2019-06-30 MED ORDER — ACETAMINOPHEN 500 MG PO TABS
1000.0000 mg | ORAL_TABLET | Freq: Once | ORAL | Status: AC
  Administered 2019-06-30: 11:00:00 1000 mg via ORAL
  Filled 2019-06-30: qty 2

## 2019-06-30 MED ORDER — ENOXAPARIN SODIUM 40 MG/0.4ML ~~LOC~~ SOLN
40.0000 mg | SUBCUTANEOUS | Status: DC
  Administered 2019-06-30 – 2019-07-06 (×6): 40 mg via SUBCUTANEOUS
  Filled 2019-06-30 (×6): qty 0.4

## 2019-06-30 MED ORDER — ONDANSETRON HCL 4 MG/2ML IJ SOLN
4.0000 mg | Freq: Four times a day (QID) | INTRAMUSCULAR | Status: DC | PRN
  Administered 2019-07-06: 4 mg via INTRAVENOUS
  Filled 2019-06-30: qty 2

## 2019-06-30 MED ORDER — SODIUM CHLORIDE 0.9 % IV SOLN: INTRAVENOUS | Status: DC

## 2019-06-30 MED ORDER — FAMOTIDINE 20 MG PO TABS
20.0000 mg | ORAL_TABLET | Freq: Every day | ORAL | Status: DC
  Administered 2019-06-30 – 2019-07-07 (×8): 20 mg via ORAL
  Filled 2019-06-30 (×9): qty 1

## 2019-06-30 MED ORDER — ALBUTEROL SULFATE (2.5 MG/3ML) 0.083% IN NEBU: 2.5000 mg | INHALATION_SOLUTION | Freq: Four times a day (QID) | RESPIRATORY_TRACT | Status: DC | PRN

## 2019-06-30 MED ORDER — ASPIRIN 81 MG PO CHEW
81.0000 mg | CHEWABLE_TABLET | Freq: Every day | ORAL | Status: DC
  Administered 2019-06-30 – 2019-07-06 (×7): 81 mg via ORAL
  Filled 2019-06-30 (×7): qty 1

## 2019-06-30 MED ORDER — SODIUM CHLORIDE 0.9 % IV SOLN
2.0000 g | Freq: Two times a day (BID) | INTRAVENOUS | Status: DC
  Administered 2019-06-30 – 2019-07-07 (×12): 2 g via INTRAVENOUS
  Filled 2019-06-30 (×14): qty 2

## 2019-06-30 MED ORDER — ROSUVASTATIN CALCIUM 5 MG PO TABS
10.0000 mg | ORAL_TABLET | ORAL | Status: DC
  Administered 2019-07-01 – 2019-07-06 (×3): 10 mg via ORAL
  Filled 2019-06-30 (×5): qty 2

## 2019-06-30 MED ORDER — MIDODRINE HCL 5 MG PO TABS
5.0000 mg | ORAL_TABLET | Freq: Two times a day (BID) | ORAL | Status: DC
  Administered 2019-06-30 – 2019-07-07 (×11): 5 mg via ORAL
  Filled 2019-06-30 (×14): qty 1

## 2019-06-30 NOTE — H&P (Addendum)
History and Physical    Jeffrey Underwood [id number] DOB: [birth year] DOA: 06/30/2019  Referring MD/NP/PA: Madalyn Rob, MD PCP: Josetta Huddle, MD  Patient coming from: Home via EMS  Chief Complaint: Confusion  I have personally briefly reviewed patient's old medical records in [place]   HPI: Jeffrey Underwood is a 86 y.o. male with medical history significant of HTN, CAD, MI, BPH, and history of melanoma presents after being found to be confused this morning by one of his daughters with whom he lives.  Apparently his daughter found him naked in his recliner this morning with rigors.  Normally he is alert and oriented, able to ambulate with use of walker, and complete ADLs without assistance.  However, this morning he was unable to carry on a conversation and kept saying "cold".  He was unable to get up and they had to call EMS.  Other associated symptoms include points of nausea.  Denies having any vomiting.  Patient had been seen a week ago at [facility] for complaints of epigastric pain found to have signs of gallstones.  He reportedly continued to complain of some epigastric pain and was ultimately sent home recommended to have outpatient HIDA scan.  He followed up with his primary care provider who recheck labs but noted that liver enzymes were relatively within normal limits and was going to arrange for him to have outpatient HIDA scan.  Patient is more alert currently has no complaints of pain at this time.  Only recent medication addition was Pepcid for acid reflux.  ED Course: Upon admission into the emergency department patient was seen to be febrile up to 103.5 F, respiration is 21-32, heart rate 94-124, blood pressures 91/68 and 119/66, and O2 saturations 89-98% on room air.  Labs significant for WBC 7.7, hemoglobin 12.8, sodium 132, potassium 2.9, BUN 38, creatinine 1.56, AST 44, ALT 28, total bilirubin 1.4, and lactic acid 2.5->2.8.  Patient had been given 2.5 L of  lactated Ringer's, Tylenol, 40 mEq of potassium chloride, vancomycin, and cefepime.  CT scan of the brain showed mild chronic ischemic white matter disease without acute abnormality.  TRH to admit.  Review of Systems  Unable to perform ROS: Mental status change  Constitutional: Positive for malaise/fatigue.  Eyes: Negative for photophobia and pain.  Cardiovascular: Negative for chest pain.  Gastrointestinal: Positive for nausea. Negative for abdominal pain and vomiting.  Genitourinary: Negative for hematuria.    Past Medical History:  Diagnosis Date  . B12 deficiency   . BPH (benign prostatic hyperplasia)    w/ urgency  . Coronary artery disease   . Degenerative cervical disc   . Diabetes mellitus without complication ([place])    0000000  . Hypertension   . Insomnia   . Melanoma ([place])   . Myocardial infarction   . Pneumonia   . Vitamin D deficiency     Past Surgical History:  Procedure Laterality Date  . HEMORRHOID SURGERY    . HERNIA REPAIR     L inguinal  . melanoma removal    . ROTATOR CUFF REPAIR     R     reports that he quit smoking about 30 years ago. He does not have any smokeless tobacco history on file. He reports that he does not use drugs. No history on file for alcohol.  Allergies  Allergen Reactions  . Demerol [Meperidine] Shortness Of Breath and Nausea And Vomiting    Family History  Problem Relation Age of Onset  . CVA  Sister     Prior to Admission medications   Medication Sig Start Date End Date Taking? Authorizing Provider  aspirin 81 MG tablet Take 81 mg by mouth at bedtime.    Yes [provider]  Camphor-Menthol (TIGER BALM EXTRA STRENGTH) 11-10 % OINT Apply 1 application topically daily as needed (shoulder pain).    Yes [provider]  cholecalciferol (VITAMIN D) 1000 UNITS tablet Take 1,000 Units by mouth daily.   Yes [provider]  cyanocobalamin 1000 MCG tablet Take 1,000 mcg by mouth at bedtime.   Yes [provider]  famotidine (PEPCID) 20 MG tablet Take 20 mg by mouth 2 (two) times daily.    Yes [provider]  Melatonin 200 MCG TABS Take 1 tablet by mouth at bedtime as needed (sleep). REMfresh   Yes [provider]  metoprolol succinate (TOPROL-XL) 50 MG 24 hr tablet Take 50 mg by mouth daily. Take with or immediately following a meal. Takes 1/2 tablet daily   Yes [provider]  midodrine (PROAMATINE) 5 MG tablet Take 5 mg by mouth in the morning and at bedtime.   Yes [provider]  nitroGLYCERIN (NITROSTAT) 0.4 MG SL tablet Place 0.4 mg under the tongue every 5 (five) minutes as needed for chest pain. Reported on 06/13/2015   Yes [provider]  OLANZapine (ZYPREXA) 5 MG tablet Take 5 mg by mouth at bedtime.  05/27/18  Yes [provider]  Omega-3 Krill Oil 300 MG CAPS Take 300 mg by mouth daily.   Yes [provider]  polyethylene glycol (MIRALAX / GLYCOLAX) packet Take 17 g by mouth daily as needed for mild constipation.    Yes [provider]  Red Yeast Rice 600 MG CAPS Take 600 mg by mouth daily.    Yes [provider]  rosuvastatin (CRESTOR) 10 MG tablet Take 10 mg by mouth 3 (three) times a week. Monday, Wednesday, and Friday at bedtime 06/08/19  Yes [provider]  Saw Palmetto, Serenoa repens, (SAW PALMETTO PO) Take 1 tablet by mouth daily.   Yes [provider]  venlafaxine XR (EFFEXOR-XR) 37.5 MG 24 hr capsule Take 37.5 mg by mouth daily. 06/06/19  Yes [provider]    Physical Exam:  Constitutional: Elderly male who appears to be diaphoretic but able to follow commands Vitals:   06/30/19 1115 06/30/19 1130 06/30/19 1145 06/30/19 1230  BP: 99/62 (!) 97/59 (!) 103/59 91/60  Pulse: (!) 104 (!) 106 100 94  Resp: (!) 22 (!) 23 (!) 21 (!) 21  Temp:      TempSrc:      SpO2: 96% (!) 89% 96% 95%  Weight:      Height:       Eyes: PERRL, lids and conjunctivae  normal ENMT: Mucous membranes are dry. Posterior pharynx clear of any exudate or lesions.  Neck: normal, supple, no masses, no thyromegaly Respiratory: clear to auscultation bilaterally, no wheezing, no crackles. Normal respiratory effort. No accessory muscle use.  Cardiovascular: Regular rate and rhythm, positive 2/6 murmur.  No extremity edema. 2+ pedal pulses. No carotid bruits.  Abdomen: no tenderness, no masses palpated. No hepatosplenomegaly. Bowel sounds positive.  Musculoskeletal: no clubbing / cyanosis. No joint deformity upper and lower extremities. Good ROM, no contractures. Normal muscle tone.  Skin: no rashes, lesions, ulcers. No induration Neurologic: CN 2-12 grossly intact. Sensation intact, DTR normal. Strength 5/5 in all 4.  Psychiatric: Patient appears to be alert  Labs on Admission: I have personally reviewed following labs and imaging studies  CBC: Recent Labs  Lab 06/30/19 1015  WBC 7.7  NEUTROABS 7.3  HGB 12.2*  HCT 36.3*  MCV 88.5  PLT PLATELET CLUMPS NOTED ON SMEAR, UNABLE TO ESTIMATE   Basic Metabolic Panel: Recent Labs  Lab 06/30/19 1015  NA 132*  K 2.9*  CL 98  CO2 21*  GLUCOSE 169*  BUN 38*  CREATININE 1.56*  CALCIUM 8.2*   GFR: Estimated Creatinine Clearance: 31.5 mL/min (A) (by C-G formula based on SCr of 1.56 mg/dL (H)). Liver Function Tests: Recent Labs  Lab 06/30/19 1015  AST 44*  ALT 28  ALKPHOS 113  BILITOT 1.4*  PROT 6.0*  ALBUMIN 3.1*   No results for input(s): LIPASE, AMYLASE in the last 168 hours. No results for input(s): AMMONIA in the last 168 hours. Coagulation Profile: Recent Labs  Lab 06/30/19 1015  INR 1.3*   Cardiac Enzymes: No results for input(s): CKTOTAL, CKMB, CKMBINDEX, TROPONINI in the last 168 hours. BNP (last 3 results) No results for input(s): PROBNP in the last 8760 hours. HbA1C: No results for input(s): HGBA1C in the last 72 hours. CBG: Recent Labs  Lab 06/30/19 1008  GLUCAP 156*   Lipid  Profile: No results for input(s): CHOL, HDL, LDLCALC, TRIG, CHOLHDL, LDLDIRECT in the last 72 hours. Thyroid Function Tests: No results for input(s): TSH, T4TOTAL, FREET4, T3FREE, THYROIDAB in the last 72 hours. Anemia Panel: No results for input(s): VITAMINB12, FOLATE, FERRITIN, TIBC, IRON, RETICCTPCT in the last 72 hours. Urine analysis:    Component Value Date/Time   COLORURINE YELLOW 06/30/2019 1030   APPEARANCEUR CLOUDY (A) 06/30/2019 1030   LABSPEC 1.016 06/30/2019 1030   PHURINE 5.0 06/30/2019 1030   GLUCOSEU 50 (A) 06/30/2019 1030   HGBUR MODERATE (A) 06/30/2019 1030   BILIRUBINUR NEGATIVE 06/30/2019 1030   KETONESUR 5 (A) 06/30/2019 1030   PROTEINUR 100 (A) 06/30/2019 1030   NITRITE NEGATIVE 06/30/2019 1030   LEUKOCYTESUR NEGATIVE 06/30/2019 1030   Sepsis Labs: Recent Results (from the past 240 hour(s))  Respiratory Panel by RT PCR (Flu A&B, Covid) - Nasopharyngeal Swab     Status: None   Collection Time: 06/30/19 11:00 AM   Specimen: Nasopharyngeal Swab  Result Value Ref Range Status   SARS Coronavirus 2 by RT PCR NEGATIVE NEGATIVE Final    Comment: (NOTE) SARS-CoV-2 target nucleic acids are NOT DETECTED. The SARS-CoV-2 RNA is generally detectable in upper respiratoy specimens during the acute phase of infection. The lowest concentration of SARS-CoV-2 viral copies this assay can detect is 131 copies/mL. A negative result does not preclude SARS-Cov-2 infection and should not be used as the sole basis for treatment or other patient management decisions. A negative result may occur with  improper specimen collection/handling, submission of specimen other than nasopharyngeal swab, presence of viral mutation(s) within the areas targeted by this assay, and inadequate number of viral copies (<131 copies/mL). A negative result must be combined with clinical observations, patient history, and epidemiological information. The expected result is Negative. Fact Sheet for  Patients:  PinkCheek.be Fact Sheet for Healthcare Providers:  GravelBags.it This test is not yet ap proved or cleared by the [place] FDA and  has been authorized for detection and/or diagnosis of SARS-CoV-2 by FDA under an Emergency Use Authorization (EUA). This EUA will remain  in effect (meaning this test can be used) for the duration of the COVID-19 declaration under Section 564(b)(1) of the Act, 21 U.S.C. section  360bbb-3(b)(1), unless the authorization is terminated or revoked sooner.    Influenza A by PCR NEGATIVE NEGATIVE Final   Influenza B by PCR NEGATIVE NEGATIVE Final    Comment: (NOTE) The Xpert Xpress SARS-CoV-2/FLU/RSV assay is intended as an aid in  the diagnosis of influenza from Nasopharyngeal swab specimens and  should not be used as a sole basis for treatment. Nasal washings and  aspirates are unacceptable for Xpert Xpress SARS-CoV-2/FLU/RSV  testing. Fact Sheet for Patients: PinkCheek.be Fact Sheet for Healthcare Providers: GravelBags.it This test is not yet approved or cleared by the [place] FDA and  has been authorized for detection and/or diagnosis of SARS-CoV-2 by  FDA under an Emergency Use Authorization (EUA). This EUA will remain  in effect (meaning this test can be used) for the duration of the  Covid-19 declaration under Section 564(b)(1) of the Act, 21  U.S.C. section 360bbb-3(b)(1), unless the authorization is  terminated or revoked. Performed at [facility], [place] [address], [place], [place] [address]      Radiological Exams on Admission: CT Head Wo Contrast  Result Date: 06/30/2019 CLINICAL DATA:  Encephalopathy. EXAM: CT HEAD WITHOUT CONTRAST TECHNIQUE: Contiguous axial images were obtained from the base of the skull through the vertex without intravenous contrast. COMPARISON:  October 29, 2012. FINDINGS: Brain: Mild  chronic ischemic white matter disease is noted. No mass effect or midline shift is noted. Ventricular size is within normal limits. There is no evidence of mass lesion, hemorrhage or acute infarction. Vascular: No hyperdense vessel or unexpected calcification. Skull: Normal. Negative for fracture or focal lesion. Sinuses/Orbits: No acute finding. Other: None. IMPRESSION: Mild chronic ischemic white matter disease. No acute intracranial abnormality seen. Electronically Signed   By: Marijo Conception M.D.   On: 06/30/2019 13:16   DG Chest Port 1 View  Result Date: 06/30/2019 CLINICAL DATA:  Sepsis, restless with fever EXAM: PORTABLE CHEST 1 VIEW COMPARISON:  06/26/2019 FINDINGS: Cardiomediastinal contours and hilar structures are normal. Lungs are clear. No acute bone finding. Spinal degenerative changes. IMPRESSION: No acute cardiopulmonary disease. Electronically Signed   By: Zetta Bills M.D.   On: 06/30/2019 10:53    EKG: Independently reviewed.  Sinus tachycardia 123 bpm  Assessment/Plan Sepsis, unknown source: Patient present 103.69F with tachycardia and tachypnea meeting SIRS criteria.  Initial lactic acid elevated up to 2.8.  Urinalysis did not show clear signs of infection and chest x-ray was otherwise noted to be clear.  COVID-19 and influenza screening were negative.  Blood and urine cultures were obtained and patient was placed on broad-spectrum antibiotics of vancomycin and cefepime. -Admit to a telemetry bed  -Add on respiratory virus panel -Continue empiric antibiotics of vancomycin and cefepime -Tylenol as needed -Trend lactic acid level  Acute metabolic encephalopathy: Patient noted to be confused.  CT scan of the brain was negative for any acute abnormalities.  After initial fluids and antibiotics family reports that patient's more alert. -Neurochecks  Acute kidney injury: Patient baseline creatinine previously noted to be1 on 06/26/2019 on care everywhere.  Patient presents with  creatinine 1.56 with BUN 38. -Continue IV fluids at 100 mL/h -Recheck bmp in a.m  Hypokalemia: Acute.  Potassium noted to be 2.9 on admission.  Patient had been given 40 mEq of potassium chloride.  Suspect potassium we will be 3.3 after that replacement. -Check magnesium level -Give additional 30 mEq of potassium chloride -Continue to monitor and place as needed  Essential hypertension: Systolic blood pressures 123456. -Continue midodrine -  Held metoprolol initially due to soft blood pressures. Restart when medically appropriate  Cholelithiasis: Patient evaluated at [facility] told last week that he had gallstones, and had been recommended outpatient HIDA scan.  At this time patient reports some nausea, but denies any high abdominal pain complaints. -Consider ordering a HIDA scan if more clearly indicated  Elevated AST, hyperbilirubinemia: Acute.  AST to slightly elevated at 44 with total bilirubin 1.4. -Recheck CMP in a.m.  Cardiac murmur: Patient not noted to have a murmur previously. -Question need of echocardiogram  Normocytic anemia: Hemoglobin 12.2 on admission. -Continue to monitor  Hyperlipidemia -Continue Crestor  Depression -Continue the Effexor and olanzapine  GERD -Continue Pepcid  DVT prophylaxis: Lovenox  Code Status: Full Family Communication: Family updated at bedside Disposition Plan: Possible discharge home once medically stable in 2 to 3 days Consults called: None Admission status: Inpatient  Norval Morton MD [facility] Pager [phone]   If 7PM-7AM, please contact night-coverage [email or web address] Password [facility]  06/30/2019, 12:56 PM

## 2019-06-30 NOTE — ED Notes (Signed)
Pt transported to CT ?

## 2019-06-30 NOTE — Progress Notes (Signed)
New Admission Note:  Arrival Method: [place] Mental Orientation: Alert x 1-2 Telemetry: Box 16 NSR Assessment: Completed Skin: Warm and dry  IV: NSL Pain: Denies Tubes: N/A Safety Measures: Safety Fall Prevention Plan initiated.  Admission: Completed 5 M  Orientation: Patient has been orientated to the room, unit and the staff. Welcome booklet given.  Family: Daughter  Orders have been reviewed and implemented. Will continue to monitor the patient. Call light has been placed within reach and bed alarm has been activated.   Sima Matas BSN, RN  Phone Number: [phone]

## 2019-06-30 NOTE — ED Notes (Signed)
Pt daughter at bedside,. EDP made aware

## 2019-06-30 NOTE — Progress Notes (Signed)
Called ER RN report. Room is ready.

## 2019-06-30 NOTE — ED Notes (Signed)
Attempted report to 21M. Per Network engineer, 21M RN to call back for report.

## 2019-06-30 NOTE — ED Notes (Addendum)
Pt to ED [facility].Pt from home, lives with daughter. c/o AMS, Pt does have hx dementia. Apparently pt was in ED one week ago for the same and was discharged, EMS reports WBC were elevated at this time, unknown if pt on antibiotics. Apparently mental status getting worse over past week. Family reports pt febrile at home. 500 ML NS bolus given by EMS. Last VS:HR 125, 94%RA, BP 130/70, TEMP 99.9 AXILLARY.

## 2019-06-30 NOTE — ED Notes (Signed)
Date and time results received: 06/30/19   Test: Lactic Acid Critical Value: 2.5  Name of Provider Notified: Roslynn Amble MD

## 2019-06-30 NOTE — ED Provider Notes (Signed)
[place] EMERGENCY DEPARTMENT Provider Note   CSN: [id number] Arrival date & time: 06/30/19  0957     History No chief complaint on file.   Jeffrey Underwood is a 86 y.o. male.  Presents to ER from home.  Daughter reports that patient has had been generally feeling unwell over the past week.  Went to [facility], concerned about possible gallstones and recommended outpatient HIDA scan.  CT scan otherwise was negative.  Reports labs were all normal.  This morning patient felt feverish, very confused at home.  No cough, no difficulty in breathing, no abdominal pain, no vomiting or diarrhea.  On direct questioning, patient mildly confused but answers appropriately, states he has no medical complaints at this time.  History limited due to mental status.  Level 5 caveat.  Additional history obtained from daughter at bedside, chart review, care everywhere Man notes.   HPI     Past Medical History:  Diagnosis Date  . B12 deficiency   . BPH (benign prostatic hyperplasia)    w/ urgency  . Coronary artery disease   . Degenerative cervical disc   . Diabetes mellitus without complication ([place])    0000000  . Hypertension   . Insomnia   . Melanoma ([place])   . Myocardial infarction   . Pneumonia   . Vitamin D deficiency     Patient Active Problem List   Diagnosis Date Noted  . Essential hypertension 06/13/2015  . Peripheral neuropathy 06/13/2015  . Enlarged prostate 06/13/2015  . Acute myocardial infarction [facility]) 06/13/2015    Past Surgical History:  Procedure Laterality Date  . HEMORRHOID SURGERY    . HERNIA REPAIR     L inguinal  . melanoma removal    . ROTATOR CUFF REPAIR     R       Family History  Problem Relation Age of Onset  . CVA Sister     Social History   Tobacco Use  . Smoking status: Former Smoker    Quit date: 06/12/1989    Years since quitting: 30.0  Substance Use Topics  . Alcohol use: Not on file  . Drug use: No     Home Medications Prior to Admission medications   Medication Sig Start Date End Date Taking? Authorizing Provider  aspirin 81 MG tablet Take 81 mg by mouth daily.    [provider]  atorvastatin (LIPITOR) 80 MG tablet Take 80 mg by mouth daily. Reported on 10/01/2015    [provider]  Camphor-Menthol (TIGER BALM EXTRA STRENGTH) 11-10 % OINT Apply 1 application topically. Reported on 06/13/2015    [provider]  carvedilol (COREG) 6.25 MG tablet Take 6.25 mg by mouth 2 (two) times daily with a meal. Reported on 10/01/2015    [provider]  cholecalciferol (VITAMIN D) 1000 UNITS tablet Take 1,000 Units by mouth daily.    [provider]  clopidogrel (PLAVIX) 75 MG tablet Take 75 mg by mouth daily. Reported on 10/01/2015    [provider]  Cyanocobalamin (VITAMIN B 12 PO) Take 1 tablet by mouth at bedtime. Reported on 10/01/2015    [provider]  famotidine (PEPCID) 10 MG tablet Take 10 mg by mouth 2 (two) times daily. Reported on 10/01/2015    [provider]  gabapentin (NEURONTIN) 600 MG tablet Take 600 mg by mouth 3 (three) times daily. Reported on 10/01/2015    [provider]  isosorbide mononitrate (IMDUR) 30 MG 24 hr tablet Take  30 mg by mouth daily.    [provider]  levofloxacin (LEVAQUIN) 500 MG tablet Take 500 mg by mouth daily. Reported on 10/01/2015    [provider]  lisinopril (PRINIVIL,ZESTRIL) 10 MG tablet Take 10 mg by mouth daily. Reported on 10/01/2015    [provider]  metoprolol succinate (TOPROL-XL) 50 MG 24 hr tablet Take 50 mg by mouth daily. Take with or immediately following a meal. Takes 1/2 tablet daily    [provider]  mirtazapine (REMERON) 15 MG tablet Take 15 mg by mouth at bedtime.    [provider]  naproxen sodium (ANAPROX) 220 MG tablet Take 220 mg by mouth 2 (two) times daily with a meal. Reported on 10/01/2015    [provider]  nitroGLYCERIN (NITROSTAT) 0.4 MG SL tablet Place 0.4 mg under the tongue every 5 (five) minutes as needed for chest pain. Reported on 06/13/2015    [provider]  nortriptyline (PAMELOR) 25 MG capsule Take 25 mg by mouth at bedtime.    [provider]  Omega-3 Krill Oil 300 MG CAPS Take 300 mg by mouth daily.    [provider]  polyethylene glycol (MIRALAX / GLYCOLAX) packet Take 17 g by mouth daily.    [provider]  Red Yeast Rice 600 MG CAPS Take 600 mg by mouth daily. Reported on 06/13/2015    [provider]  Saw Palmetto, Serenoa repens, (SAW PALMETTO PO) Take 1 tablet by mouth daily.    [provider]    Allergies    Demerol [meperidine]  Review of Systems   Review of Systems  Constitutional: Positive for fever. Negative for chills.  HENT: Negative for ear pain and sore throat.   Eyes: Negative for pain and visual disturbance.  Respiratory: Negative for cough and shortness of breath.   Cardiovascular: Negative for chest pain and palpitations.  Gastrointestinal: Negative for abdominal pain and vomiting.  Genitourinary: Negative for dysuria and hematuria.  Musculoskeletal: Negative for arthralgias and back pain.  Skin: Negative for color change and rash.  Neurological: Positive for weakness. Negative for seizures and syncope.  All other systems reviewed and are negative.   Physical Exam Updated Vital Signs There were no vitals taken for this visit.  Physical Exam Vitals and nursing note reviewed.  Constitutional:      Appearance: He is well-developed.     Comments: Mildly confused but alert, answers most questions appropriately  HENT:     Head: Normocephalic and atraumatic.  Eyes:     Conjunctiva/sclera: Conjunctivae normal.  Cardiovascular:     Rate and Rhythm: Normal rate and regular rhythm.     Comments: Systolic murmur noted Pulmonary:     Effort: Pulmonary effort is normal. No respiratory  distress.     Breath sounds: Normal breath sounds.  Abdominal:     Palpations: Abdomen is soft.     Tenderness: There is no abdominal tenderness.  Musculoskeletal:        General: No tenderness or deformity.     Cervical back: Neck supple.  Skin:    General: Skin is warm and dry.     Capillary Refill: Capillary refill takes less than 2 seconds.  Neurological:     Mental Status: He is alert.     Comments: Alert, oriented to person, place but not time; speaks clearly, moves all 4 extremities spontaneously     ED Results / Procedures / Treatments   Labs (all labs ordered are listed,  but only abnormal results are displayed) Labs Reviewed - No data to display  EKG None  Radiology No results found.  Procedures .Critical Care Performed by: Lucrezia Starch, MD Authorized by: Lucrezia Starch, MD   Critical care provider statement:    Critical care time (minutes):  45   Critical care was necessary to treat or prevent imminent or life-threatening deterioration of the following conditions:  Sepsis   Critical care was time spent personally by me on the following activities:  Discussions with consultants, evaluation of patient's response to treatment, examination of patient, ordering and performing treatments and interventions, ordering and review of laboratory studies, ordering and review of radiographic studies, pulse oximetry, re-evaluation of patient's condition, obtaining history from patient or surrogate and review of old charts   (including critical care time)  Medications Ordered in ED Medications - No data to display  ED Course  I have reviewed the triage vital signs and the nursing notes.  Pertinent labs & imaging results that were available during my care of the patient were reviewed by me and considered in my medical decision making (see chart for details).  Clinical Course as of Jun 30 1599  Thu Jun 30, 2019  1255 D/w Tamala Julian, will admit, will check CT head to r/o  acute intracranial process   [RD]    Clinical Course User Index [RD] Lucrezia Starch, MD   MDM Rules/Calculators/A&P                      86 year old gentleman presenting to ER with concern for generalized weakness, increased confusion.  Noted to have fever up to 103, concern for possible sepsis.  Initiated broad-spectrum antibiotics, fluid bolus.  No clear infectious symptoms besides fever.  CXR was negative, UA negative.  Abdomen soft nontender, LFTs within normal limits.  On physical exam did note systolic murmur however patient does have documented mild AS.  Will admit to hospitalist service for further management.  Discussed with Dr. Tamala Julian who will admit to [facility] hospitalist service.  Final Clinical Impression(s) / ED Diagnoses Final diagnoses:  Sepsis, due to unspecified organism, unspecified whether acute organ dysfunction present [facility])    Rx / DC Orders ED Discharge Orders    None       Lucrezia Starch, MD 06/30/19 [phone]

## 2019-06-30 NOTE — Progress Notes (Signed)
Pharmacy Antibiotic Note  Jeffrey Underwood is a 86 y.o. male admitted on 06/30/2019 with sepsis.  Pharmacy has been consulted for vancomycin and cefepime dosing. Pt is febrile with Tmax 103.5 and SCr is elevated above patients baseline.   Plan: Cefepime 2gm IV Q12H Vanomycinc 1gm IV x 1 then 750mg  IV Q24H F/u renal fxn, C&S, clinical status and peak/trough at SS  Height: 6' (182.9 cm) Weight: 68 kg (150 lb) IBW/kg (Calculated) : 77.6  Temp (24hrs), Avg:99.8 F (37.7 C), Min:99.8 F (37.7 C), Max:99.8 F (37.7 C)  Recent Labs  Lab 06/30/19 1015  CREATININE 1.56*    Estimated Creatinine Clearance: 31.5 mL/min (A) (by C-G formula based on SCr of 1.56 mg/dL (H)).    Allergies  Allergen Reactions  . Demerol [Meperidine] Shortness Of Breath    Antimicrobials this admission: Vanc 4/8>> Cefepime 4/8>>  Dose adjustments this admission: N/A  Microbiology results: Pending  Thank you for allowing pharmacy to be a part of this patient's care.  [name], Rande Lawman 06/30/2019 10:19 AM

## 2019-06-30 NOTE — ED Triage Notes (Signed)
Arrived [facility] family called pt was more confused then normal. Pt was last seen 1 week ago and have been getting worst. Pt. Does have a history of dementia.

## 2019-07-01 ENCOUNTER — Other Ambulatory Visit: Payer: Self-pay

## 2019-07-01 ENCOUNTER — Encounter (HOSPITAL_COMMUNITY): Payer: Self-pay | Admitting: Internal Medicine

## 2019-07-01 DIAGNOSIS — R7881 Bacteremia: Secondary | ICD-10-CM

## 2019-07-01 LAB — BLOOD CULTURE ID PANEL (REFLEXED)

## 2019-07-01 LAB — COMPREHENSIVE METABOLIC PANEL
ALT: 23 U/L (ref 0–44)
AST: 31 U/L (ref 15–41)
Albumin: 2.5 g/dL — ABNORMAL LOW (ref 3.5–5.0)
Alkaline Phosphatase: 67 U/L (ref 38–126)
Anion gap: 9 (ref 5–15)
BUN: 28 mg/dL — ABNORMAL HIGH (ref 8–23)
CO2: 26 mmol/L (ref 22–32)
Calcium: 8.4 mg/dL — ABNORMAL LOW (ref 8.9–10.3)
Chloride: 105 mmol/L (ref 98–111)
Creatinine, Ser: 1.13 mg/dL (ref 0.61–1.24)
GFR calc Af Amer: >60 mL/min (ref >60)
GFR calc non Af Amer: 58 mL/min — ABNORMAL LOW (ref >60)
Glucose, Bld: 86 mg/dL (ref 70–99)
Potassium: 4.6 mmol/L (ref 3.5–5.1)
Sodium: 140 mmol/L (ref 135–145)
Total Bilirubin: 1 mg/dL (ref 0.3–1.2)
Total Protein: 4.9 g/dL — ABNORMAL LOW (ref 6.5–8.1)

## 2019-07-01 LAB — CBC
HCT: 33.5 % — ABNORMAL LOW (ref 39.0–52.0)
Hemoglobin: 10.9 g/dL — ABNORMAL LOW (ref 13.0–17.0)
MCH: 28.9 pg (ref 26.0–34.0)
MCHC: 32.5 g/dL (ref 30.0–36.0)
MCV: 88.9 fL (ref 80.0–100.0)
Platelets: UNDETERMINED 10*3/uL (ref 150–400)
RBC: 3.77 MIL/uL — ABNORMAL LOW (ref 4.22–5.81)
RDW: 13.5 % (ref 11.5–15.5)
WBC: 11 10*3/uL — ABNORMAL HIGH (ref 4.0–10.5)
nRBC: 0 % (ref 0.0–0.2)

## 2019-07-01 LAB — URINE CULTURE: Culture: <10000 — AB

## 2019-07-01 LAB — LACTIC ACID, PLASMA: Lactic Acid, Venous: 1.5 mmol/L (ref 0.5–1.9)

## 2019-07-01 MED ORDER — MAGNESIUM SULFATE 2 GM/50ML IV SOLN
2.0000 g | Freq: Once | INTRAVENOUS | Status: AC
  Administered 2019-07-01: 2 g via INTRAVENOUS
  Filled 2019-07-01: qty 50

## 2019-07-01 MED ORDER — SODIUM CHLORIDE 0.9 % IV BOLUS
500.0000 mL | Freq: Once | INTRAVENOUS | Status: AC
  Administered 2019-07-01: 500 mL via INTRAVENOUS

## 2019-07-01 NOTE — Progress Notes (Signed)
Progress Note    Jeffrey Underwood  [id number] DOB: 1929-02-10  DOA: 06/30/2019 PCP: Josetta Huddle, MD    Brief Narrative:   Chief complaint: acute encephalopathy  Medical records reviewed and are as summarized below:  Jeffrey Underwood is a very pleasant 86 y.o. male with a past medical hx of HTN, CAD, MI, BPH melanoma presented 4/8 cc ams. Work up reveals sepsis with blood cultures +gm negative rods, fever, elevated lactic acid, tachycardia.  Provided with IV fluids and antibiotics.   Assessment/Plan:   Principal Problem:   Sepsis ([place]) Active Problems:   Acute metabolic encephalopathy   AKI (acute kidney injury) ([place])   Cholelithiasis   Bacteremia   Essential hypertension   Normocytic anemia   Hypokalemia   Hyperlipidemia   GERD (gastroesophageal reflux disease)  Sepsis with grm + blood culture and concern for cholelithiasis. Patient present 103.61F with tachycardia and tachypnea meeting SIRS criteria.  Initial lactic acid elevated up to 2.8 now within normal limits.  Urinalysis did not show clear signs of infection and chest x-ray was otherwise noted to be clear.  COVID-19 and influenza screening were negative.  Antibiotics of vancomycin and cefepime started and vanc stopped 4/9 once blood cultures back. Remains febrile, tachycardic BP low end of normal.  -continue cefepime -NS bolus -tylenol for fever -if no improvement consider HIDA scan as OP workup revealed some concern for gallbladder  -monitor closely  Acute metabolic encephalopathy: some improvement this am. Likely related to above.  CT scan of the brain was negative for any acute abnormalities. . -Neurochecks  Acute kidney injury: Patient baseline creatinine previously noted to be1 on 06/26/2019 on care everywhere.  Patient presented with creatinine 1.56 with BUN 38 and provided IV fluid. Creatinine within limits of normal this am.  -Continue IV fluids at 100 mL/h -Recheck bmp in  a.m  Hypokalemia/hypomagnesemia: Acute.  Potassium noted to be 2.9 on admission. Magnesium 1.6. Potassium repleted and resolved this am.  -replete magnesium -Continue to monitor and place as needed  Essential hypertension: Systolic blood pressures [id number] until this am. Closer to 100's now. . -Continue midodrine -iv fluids -antibiotics as noted above -Holding metoprolol   Cholelithiasis: Patient evaluated at [facility] told last week that he had gallstones, and had been recommended outpatient HIDA scan.  Currently no nausea and patient eating. LFT's within limits of normal. -Consider ordering a HIDA scan if more clearly indicated  Elevated AST, hyperbilirubinemia: Acute.  AST to slightly elevated at 44 with total bilirubin 1.4. LFT's within limits of normal this am. -Recheck CMP in a.m.  Cardiac murmur: Patient not noted to have a murmur previously. -monitor  Normocytic anemia: Hemoglobin 12.2 on admission and 10.9 this am. Likely dilutional. No s/sx active bleeding. -Continue to monitor  Hyperlipidemia -Continue Crestor  Depression -Continue the Effexor and olanzapine  GERD -Continue Pepcid   Family Communication/Anticipated D/C date and plan/Code Status   DVT prophylaxis: Lovenox ordered. Code Status: Full Code.  Family Communication: pam on phone Disposition Plan: likely home once sepsis resolved and source of bacteremia found   Medical Consultants:    None.   Anti-Infectives:    Vancomycin 4/8-4/9  Cefepime 4/8>>  Subjective:   Sitting up in bed. Denies pain/discomfort but appears acutely ill.   Objective:    Vitals:   07/01/19 0105 07/01/19 0106 07/01/19 0423 07/01/19 0848  BP:  (!) 154/95 (!) 146/68 109/65  Pulse:  79 88 (!) 108  Resp:  18 20 18   Temp:  97.9 F (36.6 C) 99 F (37.2 C)   TempSrc:  Oral Oral   SpO2: (!) 86% 90% 100% 100%  Weight:      Height:        Intake/Output Summary (Last 24 hours) at 07/01/2019  1234 Last data filed at 07/01/2019 0900 Gross per 24 hour  Intake 2010.36 ml  Output 1200 ml  Net 810.36 ml   Filed Weights   06/30/19 1013  Weight: 68 kg    Exam: Gen: well nourished somewhat pale with flushed face, appears acutely ill but no distress CV tachycardia +murmur no LE edema Respiratory: no increased work of breathing. bs distant but clear no wheeze Abdomen: non-distended, soft +BS mild diffuse tenderness to palpation no guarding or rebounding MS: joints without swelling/erythema Neuro: alert oriented to self only. Follows commands cranial nerve II-XII intact   Data Reviewed:   I have personally reviewed following labs and imaging studies:  Labs: Labs show the following:   Basic Metabolic Panel: Recent Labs  Lab 06/30/19 1015 07/01/19 0519  NA 132* 140  K 2.9* 4.6  CL 98 105  CO2 21* 26  GLUCOSE 169* 86  BUN 38* 28*  CREATININE 1.56* 1.13  CALCIUM 8.2* 8.4*  MG 1.6*  --    GFR Estimated Creatinine Clearance: 43.5 mL/min (by C-G formula based on SCr of 1.13 mg/dL). Liver Function Tests: Recent Labs  Lab 06/30/19 1015 07/01/19 0519  AST 44* 31  ALT 28 23  ALKPHOS 113 67  BILITOT 1.4* 1.0  PROT 6.0* 4.9*  ALBUMIN 3.1* 2.5*   No results for input(s): LIPASE, AMYLASE in the last 168 hours. No results for input(s): AMMONIA in the last 168 hours. Coagulation profile Recent Labs  Lab 06/30/19 1015  INR 1.3*    CBC: Recent Labs  Lab 06/30/19 1015 07/01/19 0519  WBC 7.7 11.0*  NEUTROABS 7.3  --   HGB 12.2* 10.9*  HCT 36.3* 33.5*  MCV 88.5 88.9  PLT PLATELET CLUMPS NOTED ON SMEAR, UNABLE TO ESTIMATE PLATELET CLUMPS NOTED ON SMEAR, UNABLE TO ESTIMATE   Cardiac Enzymes: No results for input(s): CKTOTAL, CKMB, CKMBINDEX, TROPONINI in the last 168 hours. BNP (last 3 results) No results for input(s): PROBNP in the last 8760 hours. CBG: Recent Labs  Lab 06/30/19 1008  GLUCAP 156*   D-Dimer: No results for input(s): DDIMER in the last  72 hours. Hgb A1c: No results for input(s): HGBA1C in the last 72 hours. Lipid Profile: No results for input(s): CHOL, HDL, LDLCALC, TRIG, CHOLHDL, LDLDIRECT in the last 72 hours. Thyroid function studies: No results for input(s): TSH, T4TOTAL, T3FREE, THYROIDAB in the last 72 hours.  Invalid input(s): FREET3 Anemia work up: No results for input(s): VITAMINB12, FOLATE, FERRITIN, TIBC, IRON, RETICCTPCT in the last 72 hours. Sepsis Labs: Recent Labs  Lab 06/30/19 1015 06/30/19 1020 06/30/19 1202 06/30/19 1429 06/30/19 1743 07/01/19 0519 07/01/19 0757  WBC 7.7  --   --   --   --  11.0*  --   LATICACIDVEN  --    < > 2.8* 2.0* 2.7*  --  1.5   < > = values in this interval not displayed.    Microbiology Recent Results (from the past 240 hour(s))  Blood Culture (routine x 2)     Status: None (Preliminary result)   Collection Time: 06/30/19 10:15 AM   Specimen: BLOOD LEFT FOREARM  Result Value Ref Range Status   Specimen Description BLOOD LEFT FOREARM  Final   Special Requests  Final    BOTTLES DRAWN AEROBIC AND ANAEROBIC Blood Culture adequate volume   Culture  Setup Time   Final    GRAM NEGATIVE RODS ANAEROBIC BOTTLE ONLY CRITICAL RESULT CALLED TO, READ BACK BY AND VERIFIED WITH: Salli Real [id number] 07/01/2019 Mena Goes Performed at [facility], [place] [address], [place], [place] [address]    Culture GRAM NEGATIVE RODS  Final   Report Status PENDING  Incomplete  Urine culture     Status: Abnormal   Collection Time: 06/30/19 10:15 AM   Specimen: In/Out Cath Urine  Result Value Ref Range Status   Specimen Description IN/OUT CATH URINE  Final   Special Requests   Final    NONE Performed at [facility], [place] [address], [place], [place] [address]    Culture (A)  Final    <10,000 COLONIES/mL MULTIPLE SPECIES PRESENT, SUGGEST RECOLLECTION   Report Status 07/01/2019 FINAL  Final  Blood Culture ID Panel (Reflexed)     Status: Abnormal   Collection Time: 06/30/19  10:15 AM  Result Value Ref Range Status   Enterococcus species NOT DETECTED NOT DETECTED Final   Listeria monocytogenes NOT DETECTED NOT DETECTED Final   Staphylococcus species NOT DETECTED NOT DETECTED Final   Staphylococcus aureus (BCID) NOT DETECTED NOT DETECTED Final   Streptococcus species NOT DETECTED NOT DETECTED Final   Streptococcus agalactiae NOT DETECTED NOT DETECTED Final   Streptococcus pneumoniae NOT DETECTED NOT DETECTED Final   Streptococcus pyogenes NOT DETECTED NOT DETECTED Final   Acinetobacter baumannii NOT DETECTED NOT DETECTED Final   Enterobacteriaceae species DETECTED (A) NOT DETECTED Final    Comment: Enterobacteriaceae represent a large family of gram negative bacteria, not a single organism. Refer to culture for further identification. CRITICAL RESULT CALLED TO, READ BACK BY AND VERIFIED WITH: G. ABBOTT,PHARMD [id number] 07/01/2019 T. TYSOR    Enterobacter cloacae complex NOT DETECTED NOT DETECTED Final   Escherichia coli NOT DETECTED NOT DETECTED Final   Klebsiella oxytoca NOT DETECTED NOT DETECTED Final   Klebsiella pneumoniae NOT DETECTED NOT DETECTED Final   Proteus species NOT DETECTED NOT DETECTED Final   Serratia marcescens NOT DETECTED NOT DETECTED Final   Carbapenem resistance NOT DETECTED NOT DETECTED Final   Haemophilus influenzae NOT DETECTED NOT DETECTED Final   Neisseria meningitidis NOT DETECTED NOT DETECTED Final   Pseudomonas aeruginosa NOT DETECTED NOT DETECTED Final   Candida albicans NOT DETECTED NOT DETECTED Final   Candida glabrata NOT DETECTED NOT DETECTED Final   Candida krusei NOT DETECTED NOT DETECTED Final   Candida parapsilosis NOT DETECTED NOT DETECTED Final   Candida tropicalis NOT DETECTED NOT DETECTED Final    Comment: Performed at [facility], [place] [address] [place], [place] [address]  Blood Culture (routine x 2)     Status: None (Preliminary result)   Collection Time: 06/30/19 10:20 AM   Specimen: BLOOD RIGHT HAND   Result Value Ref Range Status   Specimen Description BLOOD RIGHT HAND  Final   Special Requests   Final    BOTTLES DRAWN AEROBIC AND ANAEROBIC Blood Culture adequate volume   Culture  Setup Time   Final    GRAM NEGATIVE RODS AEROBIC BOTTLE ONLY CRITICAL VALUE NOTED.  VALUE IS CONSISTENT WITH PREVIOUSLY REPORTED AND CALLED VALUE. Performed at [facility], [place] [address], [place], [place] [address]    Culture GRAM NEGATIVE RODS  Final   Report Status PENDING  Incomplete  Respiratory Panel by RT PCR (  Flu A&B, Covid) - Nasopharyngeal Swab     Status: None   Collection Time: 06/30/19 11:00 AM   Specimen: Nasopharyngeal Swab  Result Value Ref Range Status   SARS Coronavirus 2 by RT PCR NEGATIVE NEGATIVE Final    Comment: (NOTE) SARS-CoV-2 target nucleic acids are NOT DETECTED. The SARS-CoV-2 RNA is generally detectable in upper respiratoy specimens during the acute phase of infection. The lowest concentration of SARS-CoV-2 viral copies this assay can detect is 131 copies/mL. A negative result does not preclude SARS-Cov-2 infection and should not be used as the sole basis for treatment or other patient management decisions. A negative result may occur with  improper specimen collection/handling, submission of specimen other than nasopharyngeal swab, presence of viral mutation(s) within the areas targeted by this assay, and inadequate number of viral copies (<131 copies/mL). A negative result must be combined with clinical observations, patient history, and epidemiological information. The expected result is Negative. Fact Sheet for Patients:  PinkCheek.be Fact Sheet for Healthcare Providers:  GravelBags.it This test is not yet ap proved or cleared by the [place] FDA and  has been authorized for detection and/or diagnosis of SARS-CoV-2 by FDA under an Emergency Use Authorization (EUA). This EUA will remain  in  effect (meaning this test can be used) for the duration of the COVID-19 declaration under Section 564(b)(1) of the Act, 21 U.S.C. section 360bbb-3(b)(1), unless the authorization is terminated or revoked sooner.    Influenza A by PCR NEGATIVE NEGATIVE Final   Influenza B by PCR NEGATIVE NEGATIVE Final    Comment: (NOTE) The Xpert Xpress SARS-CoV-2/FLU/RSV assay is intended as an aid in  the diagnosis of influenza from Nasopharyngeal swab specimens and  should not be used as a sole basis for treatment. Nasal washings and  aspirates are unacceptable for Xpert Xpress SARS-CoV-2/FLU/RSV  testing. Fact Sheet for Patients: PinkCheek.be Fact Sheet for Healthcare Providers: GravelBags.it This test is not yet approved or cleared by the [place] FDA and  has been authorized for detection and/or diagnosis of SARS-CoV-2 by  FDA under an Emergency Use Authorization (EUA). This EUA will remain  in effect (meaning this test can be used) for the duration of the  Covid-19 declaration under Section 564(b)(1) of the Act, 21  U.S.C. section 360bbb-3(b)(1), unless the authorization is  terminated or revoked. Performed at [facility], [place] [address] [place], [place] [address]     Procedures and diagnostic studies:  CT Head Wo Contrast  Result Date: 06/30/2019 CLINICAL DATA:  Encephalopathy. EXAM: CT HEAD WITHOUT CONTRAST TECHNIQUE: Contiguous axial images were obtained from the base of the skull through the vertex without intravenous contrast. COMPARISON:  October 29, 2012. FINDINGS: Brain: Mild chronic ischemic white matter disease is noted. No mass effect or midline shift is noted. Ventricular size is within normal limits. There is no evidence of mass lesion, hemorrhage or acute infarction. Vascular: No hyperdense vessel or unexpected calcification. Skull: Normal. Negative for fracture or focal lesion. Sinuses/Orbits: No acute finding.  Other: None. IMPRESSION: Mild chronic ischemic white matter disease. No acute intracranial abnormality seen. Electronically Signed   By: Marijo Conception M.D.   On: 06/30/2019 13:16   DG Chest Port 1 View  Result Date: 06/30/2019 CLINICAL DATA:  Sepsis, restless with fever EXAM: PORTABLE CHEST 1 VIEW COMPARISON:  06/26/2019 FINDINGS: Cardiomediastinal contours and hilar structures are normal. Lungs are clear. No acute bone finding. Spinal degenerative changes. IMPRESSION: No acute cardiopulmonary disease. Electronically Signed   By:  Zetta Bills M.D.   On: 06/30/2019 10:53    Medications:   . aspirin  81 mg Oral QHS  . enoxaparin (LOVENOX) injection  40 mg Subcutaneous Q24H  . famotidine  20 mg Oral Daily  . midodrine  5 mg Oral BID WC  . OLANZapine  5 mg Oral QHS  . rosuvastatin  10 mg Oral Q M,W,F-2000  . sodium chloride flush  3 mL Intravenous Q12H  . venlafaxine XR  37.5 mg Oral Q breakfast   Continuous Infusions: . sodium chloride 100 mL/hr at 07/01/19 0109  . ceFEPime (MAXIPIME) IV 2 g (07/01/19 1228)     LOS: 1 day   Radene Gunning NP  [facility]   How to contact the [facility] Attending or Consulting provider [place] or covering provider during after hours [place], for this patient?  1. Check the care team in [facility] and look for a) attending/consulting TRH provider listed and b) the [facility] team listed 2. Log into [email or web address] and use 's universal password to access. If you do not have the password, please contact the hospital operator. 3. Locate the [facility] provider you are looking for under [facility] and page to a number that you can be directly reached. 4. If you still have difficulty reaching the provider, please page the [facility] (Director on Call) for the Hospitalists listed on amion for assistance.  07/01/2019, 12:34 PM

## 2019-07-01 NOTE — Progress Notes (Signed)
PHARMACY - PHYSICIAN COMMUNICATION CRITICAL VALUE ALERT - BLOOD CULTURE IDENTIFICATION (BCID)  Jeffrey Underwood is an 86 y.o. male who presented to [facility] on 06/30/2019 with a chief complaint of AMS  Assessment:  1/2 blood cultures growing Gram negative Rods  Name of physician (or Provider) Contacted:  X Blount  Current antibiotics: Vancomycin and Cefepime   Changes to prescribed antibiotics recommended:  D/C Vancomycin, continue Cefepime for now.  F/U blood cultures for ID and sensitivies  Results for orders placed or performed during the hospital encounter of 06/30/19  Blood Culture ID Panel (Reflexed) (Collected: 06/30/2019 10:15 AM)  Result Value Ref Range   Enterococcus species NOT DETECTED NOT DETECTED   Listeria monocytogenes NOT DETECTED NOT DETECTED   Staphylococcus species NOT DETECTED NOT DETECTED   Staphylococcus aureus (BCID) NOT DETECTED NOT DETECTED   Streptococcus species NOT DETECTED NOT DETECTED   Streptococcus agalactiae NOT DETECTED NOT DETECTED   Streptococcus pneumoniae NOT DETECTED NOT DETECTED   Streptococcus pyogenes NOT DETECTED NOT DETECTED   Acinetobacter baumannii NOT DETECTED NOT DETECTED   Enterobacteriaceae species DETECTED (A) NOT DETECTED   Enterobacter cloacae complex NOT DETECTED NOT DETECTED   Escherichia coli NOT DETECTED NOT DETECTED   Klebsiella oxytoca NOT DETECTED NOT DETECTED   Klebsiella pneumoniae NOT DETECTED NOT DETECTED   Proteus species NOT DETECTED NOT DETECTED   Serratia marcescens NOT DETECTED NOT DETECTED   Carbapenem resistance NOT DETECTED NOT DETECTED   Haemophilus influenzae NOT DETECTED NOT DETECTED   Neisseria meningitidis NOT DETECTED NOT DETECTED   Pseudomonas aeruginosa NOT DETECTED NOT DETECTED   Candida albicans NOT DETECTED NOT DETECTED   Candida glabrata NOT DETECTED NOT DETECTED   Candida krusei NOT DETECTED NOT DETECTED   Candida parapsilosis NOT DETECTED NOT DETECTED   Candida tropicalis NOT DETECTED NOT  DETECTED    Jeffrey Underwood 07/01/2019  5:10 AM

## 2019-07-01 NOTE — TOC Initial Note (Signed)
Transition of Care [facility] [place]) - Initial/Assessment Note    Patient Details  Name: Jeffrey Underwood MRN: [id number] Date of Birth: October 29, 1928  [facility]) CM/SW Contact:    Bartholomew Crews, RN Phone Number: [phone] 07/01/2019, 2:45 PM  Clinical Narrative:                  Spoke with patient and daughter, Jeannene Patella, at the bedside. PTA patient living in his own home with his daughters. Has DME in the home - hospital bed, rollator, cane, 3N1, and safety bars throughout home. Home was outfitted for patient's wife when she had back surgery. Patient is typically ambulatory with a cane, and is able to perform simple tasks, like making a sandwich.   Discussed PT and OT evals pending. Daughter would prefer that patient return home with [facility] if recommended - agency choice is [place]. If SNF recommended, may consider [facility], but preference is for patient to return home.   PCP and pharmacy in [place] confirmed as correct. Patient address confirmed.   TOC following for transition needs.   Expected Discharge Plan: [place] Barriers to Discharge: Continued Medical Work up   Patient Goals and CMS Choice Patient states their goals for this hospitalization and ongoing recovery are:: return home with daughters and get back to his old self CMS Medicare.gov Compare Post Acute Care list provided to:: Patient Choice offered to / list presented to : Patient, Adult Children  Expected Discharge Plan and Services Expected Discharge Plan: [place]   Discharge Planning Services: CM Consult Post Acute Care Choice: [place] arrangements for the past 2 months: Single Family Home                                      Prior Living Arrangements/Services Living arrangements for the past 2 months: Single Family Home Lives with:: Self, Adult Children Patient language and need for interpreter reviewed:: Yes Do you feel safe  going back to the place where you live?: Yes      Need for Family Participation in Patient Care: Yes (Comment) Care giver support system in place?: Yes (comment) Current home services: DME(hospital bed, rollator, cane, 3N1) Criminal Activity/Legal Involvement Pertinent to Current Situation/Hospitalization: No - Comment as needed  Activities of Daily Living Home Assistive Devices/Equipment: Gilford Rile (specify type), Hospital bed, Wheelchair, [place] (specify quad or straight) ADL Screening (condition at time of admission) Patient's cognitive ability adequate to safely complete daily activities?: No Is the patient deaf or have difficulty hearing?: Yes Does the patient have difficulty seeing, even when wearing glasses/contacts?: No Does the patient have difficulty concentrating, remembering, or making decisions?: Yes Patient able to express need for assistance with ADLs?: Yes Does the patient have difficulty dressing or bathing?: No Independently performs ADLs?: No Communication: Independent Dressing (OT): Independent Grooming: Independent Feeding: Independent Bathing: Needs assistance Is this a change from baseline?: Pre-admission baseline Toileting: Needs assistance Is this a change from baseline?: Pre-admission baseline In/Out Bed: Needs assistance Is this a change from baseline?: Pre-admission baseline Walks in Home: Needs assistance Is this a change from baseline?: Pre-admission baseline Does the patient have difficulty walking or climbing stairs?: Yes Weakness of Legs: Both Weakness of Arms/Hands: None  Permission Sought/Granted Permission sought to share information with : Family Supports    Share Information with NAME: Jeannene Patella  Permission granted to share info w Relationship: daughter     Emotional Assessment Appearance:: Appears stated age Attitude/Demeanor/Rapport: Engaged Affect (typically observed): Accepting Orientation: : Oriented to Self, Oriented to  Time, Oriented  to Place, Oriented to Situation Alcohol / Substance Use: Not Applicable Psych Involvement: No (comment)  Admission diagnosis:  Fever in adult [R50.9] Sepsis ([place]) [A41.9] Sepsis, due to unspecified organism, unspecified whether acute organ dysfunction present [facility]) [A41.9] Patient Active Problem List   Diagnosis Date Noted  . Bacteremia 07/01/2019  . Sepsis ([place]) 06/30/2019  . Acute metabolic encephalopathy [id number]  . AKI (acute kidney injury) ([place]) 06/30/2019  . Normocytic anemia 06/30/2019  . Hyperlipidemia 06/30/2019  . GERD (gastroesophageal reflux disease) 06/30/2019  . Hypokalemia 06/30/2019  . Cholelithiasis 06/30/2019  . Essential hypertension 06/13/2015  . Peripheral neuropathy 06/13/2015  . Enlarged prostate 06/13/2015  . Acute myocardial infarction [facility]) 06/13/2015   PCP:  Josetta Huddle, MD Pharmacy:   [id number] [place], [place] [address] Phone: [phone] Fax: [phone]     Social Determinants of Health (SDOH) Interventions    Readmission Risk Interventions No flowsheet data found.

## 2019-07-02 ENCOUNTER — Inpatient Hospital Stay (HOSPITAL_COMMUNITY): Payer: Medicare Other

## 2019-07-02 DIAGNOSIS — I350 Nonrheumatic aortic (valve) stenosis: Secondary | ICD-10-CM

## 2019-07-02 LAB — COMPREHENSIVE METABOLIC PANEL
ALT: 20 U/L (ref 0–44)
AST: 25 U/L (ref 15–41)
Albumin: 2.5 g/dL — ABNORMAL LOW (ref 3.5–5.0)
Alkaline Phosphatase: 75 U/L (ref 38–126)
Anion gap: 10 (ref 5–15)
BUN: 20 mg/dL (ref 8–23)
CO2: 24 mmol/L (ref 22–32)
Calcium: 7.8 mg/dL — ABNORMAL LOW (ref 8.9–10.3)
Chloride: 107 mmol/L (ref 98–111)
Creatinine, Ser: 0.97 mg/dL (ref 0.61–1.24)
GFR calc Af Amer: >60 mL/min (ref >60)
GFR calc non Af Amer: >60 mL/min (ref >60)
Glucose, Bld: 88 mg/dL (ref 70–99)
Potassium: 3.5 mmol/L (ref 3.5–5.1)
Sodium: 141 mmol/L (ref 135–145)
Total Bilirubin: 0.8 mg/dL (ref 0.3–1.2)
Total Protein: 5.2 g/dL — ABNORMAL LOW (ref 6.5–8.1)

## 2019-07-02 LAB — CBC
HCT: 35 % — ABNORMAL LOW (ref 39.0–52.0)
Hemoglobin: 11.6 g/dL — ABNORMAL LOW (ref 13.0–17.0)
MCH: 29.4 pg (ref 26.0–34.0)
MCHC: 33.1 g/dL (ref 30.0–36.0)
MCV: 88.8 fL (ref 80.0–100.0)
Platelets: UNDETERMINED 10*3/uL (ref 150–400)
RBC: 3.94 MIL/uL — ABNORMAL LOW (ref 4.22–5.81)
RDW: 13.7 % (ref 11.5–15.5)
WBC: 7.9 10*3/uL (ref 4.0–10.5)
nRBC: 0 % (ref 0.0–0.2)

## 2019-07-02 LAB — GLUCOSE, CAPILLARY: Glucose-Capillary: 78 mg/dL (ref 70–99)

## 2019-07-02 LAB — ECHOCARDIOGRAM COMPLETE
Height: 72 in
Weight: 2400 oz

## 2019-07-02 MED ORDER — METOPROLOL SUCCINATE ER 50 MG PO TB24
50.0000 mg | ORAL_TABLET | Freq: Every day | ORAL | Status: DC
  Administered 2019-07-02 – 2019-07-07 (×6): 50 mg via ORAL
  Filled 2019-07-02 (×6): qty 1

## 2019-07-02 NOTE — Evaluation (Signed)
Occupational Therapy Evaluation Patient Details Name: Jeffrey Underwood MRN: [id number] DOB: 05/26/1928 Today's Date: 07/02/2019    History of Present Illness Pt is an 86 yo male s/p sepsis, tachycardia, AKI, fever. Pt PMHx: HTN, CAD, MI, BPH, melanoma.   Clinical Impression   Pt PTA: Pt living with family who provide 24/7 care. Pt was independent with ADL and family performing IADL tasks. Pt currently with cognitive deficits requiring cues to perform tasks, but able to follow simple commands with increased time. Pt with decreased activity tolerance at this time. Pt minA overall for ADL tasks and minA overall for mobility with RW. Pt prefers use of rollator at home and requiring cues to use RW. Pt would benefit from continued OT skilled services for ADL, mobility and cognition. OT following acutely.    Follow Up Recommendations  Home health OT;Supervision/Assistance - 24 hour    Equipment Recommendations  None recommended by OT    Recommendations for Other Services       Precautions / Restrictions Precautions Precautions: Fall Restrictions Weight Bearing Restrictions: No      Mobility Bed Mobility Overal bed mobility: Needs Assistance Bed Mobility: Supine to Sit;Sit to Supine     Supine to sit: Min guard Sit to supine: Min guard      Transfers Overall transfer level: Needs assistance Equipment used: Rolling walker (2 wheeled) Transfers: Sit to/from [facility] Sit to Stand: Min assist Stand pivot transfers: Min assist            Balance Overall balance assessment: Mild deficits observed, not formally tested                                         ADL either performed or assessed with clinical judgement   ADL Overall ADL's : Needs assistance/impaired Eating/Feeding: Set up;Sitting   Grooming: Min guard;Standing   Upper Body Bathing: Min guard;Standing   Lower Body Bathing: Minimal assistance;Sitting/lateral leans;Sit  to/from stand;Cueing for safety;Cueing for sequencing   Upper Body Dressing : Min guard;Sitting;Standing   Lower Body Dressing: Minimal assistance;Sitting/lateral leans;Sit to/from stand   Toilet Transfer: Minimal assistance;Ambulation;Regular Toilet;Grab bars;RW   Toileting- Clothing Manipulation and Hygiene: Minimal assistance;Cueing for safety;Cueing for sequencing;Sitting/lateral lean;Sit to/from stand       Functional mobility during ADLs: Minimal assistance;Cueing for safety;Cueing for sequencing;Rolling walker General ADL Comments: Pt used to using rollator at home and requiring cues to roll the RW and eventually got the hang of it. Pt requiring cues to perform tasks; able to follow simple commands with increased time. Pt's daughter in room and pt not always staying on topic- she agreed this was different from baseline.     Vision Baseline Vision/History: No visual deficits Patient Visual Report: No change from baseline Vision Assessment?: No apparent visual deficits     Perception     Praxis      Pertinent Vitals/Pain Pain Assessment: 0-10 Pain Score: 0-No pain     Hand Dominance Right   Extremity/Trunk Assessment Upper Extremity Assessment Upper Extremity Assessment: Generalized weakness   Lower Extremity Assessment Lower Extremity Assessment: Generalized weakness;Defer to PT evaluation   Cervical / Trunk Assessment Cervical / Trunk Assessment: Kyphotic   Communication Communication Communication: No difficulties   Cognition Arousal/Alertness: Awake/alert Behavior During Therapy: WFL for tasks assessed/performed Overall Cognitive Status: Impaired/Different from baseline Area of Impairment: Memory;Safety/judgement;Awareness;Problem solving;Following commands  Memory: Decreased short-term memory Following Commands: Follows one step commands with increased time Safety/Judgement: Decreased awareness of safety;Decreased awareness of  deficits Awareness: Emergent Problem Solving: Slow processing;Difficulty sequencing General Comments: Pt A/O x4; pt following 1 step commands with increased time.    General Comments  DAughter in room. SpO2 >95% on RA    Exercises     Shoulder Instructions      Home Living Family/patient expects to be discharged to:: Private residence Living Arrangements: Children Available Help at Discharge: Family;Available 24 hours/day Type of Home: House Home Access: Stairs to enter [employer] of Steps: 3 Entrance Stairs-Rails: Right Home Layout: One level     Bathroom Shower/Tub: Occupational psychologist: Handicapped height Bathroom Accessibility: Yes How Accessible: Accessible via walker Home Equipment: Temple - 4 wheels;Cane - single point;Bedside commode;Grab bars - toilet;Grab bars - tub/shower;Hand held shower head;Hospital bed          Prior Functioning/Environment Level of Independence: Needs assistance  Gait / Transfers Assistance Needed: SPC for mobility indoors; outdoors using rollator ADL's / Homemaking Assistance Needed: grooming/bathe/dress with independence; fixes sandwiches; IADLs performed by children            OT Problem List: Decreased strength;Decreased activity tolerance;Impaired balance (sitting and/or standing);Decreased coordination;Decreased safety awareness;Pain;Impaired vision/perception;Decreased cognition      OT Treatment/Interventions: Self-care/ADL training;Therapeutic exercise;Neuromuscular education;Energy conservation;Therapeutic activities;Cognitive remediation/compensation;Patient/family education;Balance training    OT Goals(Current goals can be found in the care plan section) Acute Rehab OT Goals Patient Stated Goal: per daughter: "to go home" OT Goal Formulation: With patient/family Time For Goal Achievement: 07/16/19 Potential to Achieve Goals: Good ADL Goals Pt Will Perform Lower Body Dressing: with  supervision;sitting/lateral leans;sit to/from stand Pt Will Transfer to Toilet: with supervision;ambulating;bedside commode Pt Will Perform Toileting - Clothing Manipulation and hygiene: with supervision;sitting/lateral leans;sit to/from stand Additional ADL Goal #1: Pt will stand at sink x10 mins for ADL tasks with supervisionA in order to increase activity tolerance. Additional ADL Goal #2: Pt will follow multi step commands consistently with minimal cues to attend to task.  OT Frequency: Min 2X/week   Barriers to D/C:            Co-evaluation              AM-PAC OT "6 Clicks" Daily Activity     Outcome Measure Help from another person eating meals?: A Little Help from another person taking care of personal grooming?: A Little Help from another person toileting, which includes using toliet, bedpan, or urinal?: A Little Help from another person bathing (including washing, rinsing, drying)?: A Little Help from another person to put on and taking off regular upper body clothing?: A Little Help from another person to put on and taking off regular lower body clothing?: A Little 6 Click Score: 18   End of Session Equipment Utilized During Treatment: Gait belt;Rolling walker Nurse Communication: Mobility status  Activity Tolerance: Patient tolerated treatment well Patient left: in bed;with call bell/phone within reach;with bed alarm set;with family/visitor present  OT Visit Diagnosis: Unsteadiness on feet (R26.81);Muscle weakness (generalized) (M62.81);Other symptoms and signs involving cognitive function                Time: 1437-1511 OT Time Calculation (min): 34 min Charges:  OT General Charges $OT Visit: 1 Visit OT Evaluation $OT Eval Moderate Complexity: 1 Mod OT Treatments $Self Care/Home Management : 8-22 mins  Jefferey Pica, OTR/L Acute Rehabilitation Services Pager: [phone] Office: [phone]   Hilary Pundt C 07/02/2019,  4:23 PM

## 2019-07-02 NOTE — Plan of Care (Signed)
  Problem: Education: Goal: Knowledge of General Education information will improve Description: Including pain rating scale, medication(s)/side effects and non-pharmacologic comfort measures Outcome: Completed/Met   Problem: Nutrition: Goal: Adequate nutrition will be maintained Outcome: Completed/Met   Problem: Coping: Goal: Level of anxiety will decrease Outcome: Completed/Met   Problem: Elimination: Goal: Will not experience complications related to bowel motility Outcome: Completed/Met Goal: Will not experience complications related to urinary retention Outcome: Completed/Met   Problem: Pain Managment: Goal: General experience of comfort will improve Outcome: Completed/Met   Problem: Skin Integrity: Goal: Risk for impaired skin integrity will decrease Outcome: Completed/Met   Problem: Fluid Volume: Goal: Hemodynamic stability will improve Outcome: Completed/Met   Problem: Clinical Measurements: Goal: Diagnostic test results will improve Outcome: Completed/Met Goal: Signs and symptoms of infection will decrease Outcome: Completed/Met

## 2019-07-02 NOTE — Progress Notes (Signed)
  Echocardiogram 2D Echocardiogram has been performed.  Jeffrey Underwood 07/02/2019, 2:50 PM

## 2019-07-02 NOTE — Progress Notes (Signed)
PT Cancellation Note  Patient Details Name: Jeffrey Underwood MRN: [id number] DOB: March 19, 1929   Cancelled Treatment:    Reason Eval/Treat Not Completed: Patient at procedure or test/unavailable (bedside echo). PT will continue to f/u with pt acutely as available.    Clearnce Sorrel Mekaila Tarnow 07/02/2019, 2:26 PM

## 2019-07-02 NOTE — Progress Notes (Signed)
Progress Note    Jeffrey Underwood  [id number] DOB: 1929/01/06  DOA: 06/30/2019 PCP: Josetta Huddle, MD    Brief Narrative:   Chief complaint: acute encephalopathy  Medical records reviewed and are as summarized below:  Jeffrey Underwood is a very pleasant 86 y.o. male with a past medical hx of HTN, CAD, MI, BPH melanoma presented 4/8 cc ams. Work up reveals sepsis with blood cultures +gm negative rods, fever, elevated lactic acid, tachycardia.  Provided with IV fluids and antibiotics.   Assessment/Plan:   Principal Problem:   Sepsis ([place]) Active Problems:   Essential hypertension   Acute metabolic encephalopathy   AKI (acute kidney injury) ([place])   Normocytic anemia   Hyperlipidemia   GERD (gastroesophageal reflux disease)   Hypokalemia   Cholelithiasis   Bacteremia  Sepsis with grm - in blood culture and concern for cholelithiasis.  - Urinalysis did not show clear signs of infection and chest x-ray was otherwise noted to be clear.   -COVID-19 and influenza screening were negative.   -Antibiotics of vancomycin and cefepime started and vanc stopped 4/9 once blood cultures back. -continue cefepime -Await culture results -tylenol for fever -if no improvement consider HIDA scan as OP workup revealed some concern for gallbladder  -monitor closely  Acute metabolic encephalopathy:  -Improved  Acute kidney injury:  -Solved  Hypokalemia/hypomagnesemia:  -Repleted  Essential hypertension:  -Resume metoprolol  Cholelithiasis: Patient evaluated at [facility] told last week that he had gallstones, and had been recommended outpatient HIDA scan.  Currently no nausea and patient eating. LFT's within limits of normal. -Consider ordering a HIDA scan if more clearly indicated when culture is back  Elevated AST, hyperbilirubinemia - LFT's within limits   Cardiac murmur: Patient not noted to have a murmur previously. -Order echo  Normocytic anemia:   -Trend  Hyperlipidemia -Continue Crestor  Depression -Continue the Effexor and olanzapine  GERD -Continue Pepcid   Family Communication/Anticipated D/C date and plan/Code Status   DVT prophylaxis: Lovenox ordered. Code Status: Full Code.  Family Communication: pam on phone 4/9 Disposition Plan: From home, lives with daughter: Likely home once sepsis resolved and source of bacteremia found   Medical Consultants:    None.   Anti-Infectives:    Vancomycin 4/8-4/9  Cefepime 4/8>>  Subjective:   Says he is feeling better today.  Still does not have much of an appetite  Objective:    Vitals:   07/01/19 1639 07/01/19 2031 07/02/19 0426 07/02/19 0931  BP: 139/68 132/68 (!) 155/79 139/81  Pulse: 76 75 86 (!) 103  Resp: 18 19 19 18   Temp: 97.8 F (36.6 C) 98.2 F (36.8 C) 97.7 F (36.5 C) 98.6 F (37 C)  TempSrc: Oral Oral Oral Oral  SpO2: 99% 100% 100% 100%  Weight:      Height:        Intake/Output Summary (Last 24 hours) at 07/02/2019 1300 Last data filed at 07/02/2019 0708 Gross per 24 hour  Intake 2483.13 ml  Output 1551 ml  Net 932.13 ml   Filed Weights   06/30/19 1013  Weight: 68 kg    Exam: In bed, no acute distress Pleasant and cooperative Regular rate and rhythm, positive murmur No LE edema   Data Reviewed:   I have personally reviewed following labs and imaging studies:  Labs: Labs show the following:   Basic Metabolic Panel: Recent Labs  Lab 06/30/19 1015 06/30/19 1015 07/01/19 0519 07/02/19 0519  NA 132*  --  140 141  K 2.9*   < > 4.6 3.5  CL 98  --  105 107  CO2 21*  --  26 24  GLUCOSE 169*  --  86 88  BUN 38*  --  28* 20  CREATININE 1.56*  --  1.13 0.97  CALCIUM 8.2*  --  8.4* 7.8*  MG 1.6*  --   --   --    < > = values in this interval not displayed.   GFR Estimated Creatinine Clearance: 50.6 mL/min (by C-G formula based on SCr of 0.97 mg/dL). Liver Function Tests: Recent Labs  Lab 06/30/19 1015  07/01/19 0519 07/02/19 0519  AST 44* 31 25  ALT 28 23 20   ALKPHOS 113 67 75  BILITOT 1.4* 1.0 0.8  PROT 6.0* 4.9* 5.2*  ALBUMIN 3.1* 2.5* 2.5*   No results for input(s): LIPASE, AMYLASE in the last 168 hours. No results for input(s): AMMONIA in the last 168 hours. Coagulation profile Recent Labs  Lab 06/30/19 1015  INR 1.3*    CBC: Recent Labs  Lab 06/30/19 1015 07/01/19 0519 07/02/19 0519  WBC 7.7 11.0* 7.9  NEUTROABS 7.3  --   --   HGB 12.2* 10.9* 11.6*  HCT 36.3* 33.5* 35.0*  MCV 88.5 88.9 88.8  PLT PLATELET CLUMPS NOTED ON SMEAR, UNABLE TO ESTIMATE PLATELET CLUMPS NOTED ON SMEAR, UNABLE TO ESTIMATE PLATELET CLUMPS NOTED ON SMEAR, UNABLE TO ESTIMATE   Cardiac Enzymes: No results for input(s): CKTOTAL, CKMB, CKMBINDEX, TROPONINI in the last 168 hours. BNP (last 3 results) No results for input(s): PROBNP in the last 8760 hours. CBG: Recent Labs  Lab 06/30/19 1008 07/02/19 0641  GLUCAP 156* 78   D-Dimer: No results for input(s): DDIMER in the last 72 hours. Hgb A1c: No results for input(s): HGBA1C in the last 72 hours. Lipid Profile: No results for input(s): CHOL, HDL, LDLCALC, TRIG, CHOLHDL, LDLDIRECT in the last 72 hours. Thyroid function studies: No results for input(s): TSH, T4TOTAL, T3FREE, THYROIDAB in the last 72 hours.  Invalid input(s): FREET3 Anemia work up: No results for input(s): VITAMINB12, FOLATE, FERRITIN, TIBC, IRON, RETICCTPCT in the last 72 hours. Sepsis Labs: Recent Labs  Lab 06/30/19 1015 06/30/19 1020 06/30/19 1202 06/30/19 1429 06/30/19 1743 07/01/19 0519 07/01/19 0757 07/02/19 0519  WBC 7.7  --   --   --   --  11.0*  --  7.9  LATICACIDVEN  --    < > 2.8* 2.0* 2.7*  --  1.5  --    < > = values in this interval not displayed.    Microbiology Recent Results (from the past 240 hour(s))  Blood Culture (routine x 2)     Status: Abnormal (Preliminary result)   Collection Time: 06/30/19 10:15 AM   Specimen: BLOOD LEFT FOREARM   Result Value Ref Range Status   Specimen Description BLOOD LEFT FOREARM  Final   Special Requests   Final    BOTTLES DRAWN AEROBIC AND ANAEROBIC Blood Culture adequate volume   Culture  Setup Time   Final    GRAM NEGATIVE RODS IN BOTH AEROBIC AND ANAEROBIC BOTTLES CRITICAL RESULT CALLED TO, READ BACK BY AND VERIFIED WITH: Salli Real [id number] 07/01/2019 Mena Goes Performed at [facility], [place] [address] [place], [place] [address]    Culture ENTEROBACTER AEROGENES (A)  Final   Report Status PENDING  Incomplete  Urine culture     Status: Abnormal   Collection Time: 06/30/19 10:15 AM   Specimen: In/Out Cath Urine  Result Value Ref Range Status   Specimen Description IN/OUT CATH URINE  Final   Special Requests   Final    NONE Performed at [facility], [place] [address], [place], [place] [address]    Culture (A)  Final    <10,000 COLONIES/mL MULTIPLE SPECIES PRESENT, SUGGEST RECOLLECTION   Report Status 07/01/2019 FINAL  Final  Blood Culture ID Panel (Reflexed)     Status: Abnormal   Collection Time: 06/30/19 10:15 AM  Result Value Ref Range Status   Enterococcus species NOT DETECTED NOT DETECTED Final   Listeria monocytogenes NOT DETECTED NOT DETECTED Final   Staphylococcus species NOT DETECTED NOT DETECTED Final   Staphylococcus aureus (BCID) NOT DETECTED NOT DETECTED Final   Streptococcus species NOT DETECTED NOT DETECTED Final   Streptococcus agalactiae NOT DETECTED NOT DETECTED Final   Streptococcus pneumoniae NOT DETECTED NOT DETECTED Final   Streptococcus pyogenes NOT DETECTED NOT DETECTED Final   Acinetobacter baumannii NOT DETECTED NOT DETECTED Final   Enterobacteriaceae species DETECTED (A) NOT DETECTED Final    Comment: Enterobacteriaceae represent a large family of gram negative bacteria, not a single organism. Refer to culture for further identification. CRITICAL RESULT CALLED TO, READ BACK BY AND VERIFIED WITH: G. ABBOTT,PHARMD [id number] 07/01/2019 T. TYSOR     Enterobacter cloacae complex NOT DETECTED NOT DETECTED Final   Escherichia coli NOT DETECTED NOT DETECTED Final   Klebsiella oxytoca NOT DETECTED NOT DETECTED Final   Klebsiella pneumoniae NOT DETECTED NOT DETECTED Final   Proteus species NOT DETECTED NOT DETECTED Final   Serratia marcescens NOT DETECTED NOT DETECTED Final   Carbapenem resistance NOT DETECTED NOT DETECTED Final   Haemophilus influenzae NOT DETECTED NOT DETECTED Final   Neisseria meningitidis NOT DETECTED NOT DETECTED Final   Pseudomonas aeruginosa NOT DETECTED NOT DETECTED Final   Candida albicans NOT DETECTED NOT DETECTED Final   Candida glabrata NOT DETECTED NOT DETECTED Final   Candida krusei NOT DETECTED NOT DETECTED Final   Candida parapsilosis NOT DETECTED NOT DETECTED Final   Candida tropicalis NOT DETECTED NOT DETECTED Final    Comment: Performed at [facility], [place] [address], [place], [place] [address]  Blood Culture (routine x 2)     Status: Abnormal (Preliminary result)   Collection Time: 06/30/19 10:20 AM   Specimen: BLOOD RIGHT HAND  Result Value Ref Range Status   Specimen Description BLOOD RIGHT HAND  Final   Special Requests   Final    BOTTLES DRAWN AEROBIC AND ANAEROBIC Blood Culture adequate volume   Culture  Setup Time   Final    GRAM NEGATIVE RODS IN BOTH AEROBIC AND ANAEROBIC BOTTLES CRITICAL VALUE NOTED.  VALUE IS CONSISTENT WITH PREVIOUSLY REPORTED AND CALLED VALUE. Performed at [facility], [place] [address] [place], [place] [address]    Culture ENTEROBACTER AEROGENES (A)  Final   Report Status PENDING  Incomplete  Respiratory Panel by RT PCR (Flu A&B, Covid) - Nasopharyngeal Swab     Status: None   Collection Time: 06/30/19 11:00 AM   Specimen: Nasopharyngeal Swab  Result Value Ref Range Status   SARS Coronavirus 2 by RT PCR NEGATIVE NEGATIVE Final    Comment: (NOTE) SARS-CoV-2 target nucleic acids are NOT DETECTED. The SARS-CoV-2 RNA is generally detectable in upper  respiratoy specimens during the acute phase of infection. The lowest concentration of SARS-CoV-2 viral copies this assay can detect is 131 copies/mL. A negative result does not preclude SARS-Cov-2 infection and should not be used  as the sole basis for treatment or other patient management decisions. A negative result may occur with  improper specimen collection/handling, submission of specimen other than nasopharyngeal swab, presence of viral mutation(s) within the areas targeted by this assay, and inadequate number of viral copies (<131 copies/mL). A negative result must be combined with clinical observations, patient history, and epidemiological information. The expected result is Negative. Fact Sheet for Patients:  PinkCheek.be Fact Sheet for Healthcare Providers:  GravelBags.it This test is not yet ap proved or cleared by the [place] FDA and  has been authorized for detection and/or diagnosis of SARS-CoV-2 by FDA under an Emergency Use Authorization (EUA). This EUA will remain  in effect (meaning this test can be used) for the duration of the COVID-19 declaration under Section 564(b)(1) of the Act, 21 U.S.C. section 360bbb-3(b)(1), unless the authorization is terminated or revoked sooner.    Influenza A by PCR NEGATIVE NEGATIVE Final   Influenza B by PCR NEGATIVE NEGATIVE Final    Comment: (NOTE) The Xpert Xpress SARS-CoV-2/FLU/RSV assay is intended as an aid in  the diagnosis of influenza from Nasopharyngeal swab specimens and  should not be used as a sole basis for treatment. Nasal washings and  aspirates are unacceptable for Xpert Xpress SARS-CoV-2/FLU/RSV  testing. Fact Sheet for Patients: PinkCheek.be Fact Sheet for Healthcare Providers: GravelBags.it This test is not yet approved or cleared by the [place] FDA and  has been authorized for  detection and/or diagnosis of SARS-CoV-2 by  FDA under an Emergency Use Authorization (EUA). This EUA will remain  in effect (meaning this test can be used) for the duration of the  Covid-19 declaration under Section 564(b)(1) of the Act, 21  U.S.C. section 360bbb-3(b)(1), unless the authorization is  terminated or revoked. Performed at [facility], [place] [address] [place], [place] [address]     Procedures and diagnostic studies:  CT Head Wo Contrast  Result Date: 06/30/2019 CLINICAL DATA:  Encephalopathy. EXAM: CT HEAD WITHOUT CONTRAST TECHNIQUE: Contiguous axial images were obtained from the base of the skull through the vertex without intravenous contrast. COMPARISON:  October 29, 2012. FINDINGS: Brain: Mild chronic ischemic white matter disease is noted. No mass effect or midline shift is noted. Ventricular size is within normal limits. There is no evidence of mass lesion, hemorrhage or acute infarction. Vascular: No hyperdense vessel or unexpected calcification. Skull: Normal. Negative for fracture or focal lesion. Sinuses/Orbits: No acute finding. Other: None. IMPRESSION: Mild chronic ischemic white matter disease. No acute intracranial abnormality seen. Electronically Signed   By: Marijo Conception M.D.   On: 06/30/2019 13:16    Medications:   . aspirin  81 mg Oral QHS  . enoxaparin (LOVENOX) injection  40 mg Subcutaneous Q24H  . famotidine  20 mg Oral Daily  . metoprolol succinate  50 mg Oral Daily  . midodrine  5 mg Oral BID WC  . OLANZapine  5 mg Oral QHS  . rosuvastatin  10 mg Oral Q M,W,F-2000  . sodium chloride flush  3 mL Intravenous Q12H  . venlafaxine XR  37.5 mg Oral Q breakfast   Continuous Infusions: . sodium chloride 75 mL/hr at 07/02/19 0611  . ceFEPime (MAXIPIME) IV 2 g (07/02/19 1126)     LOS: 2 days   Geradine Girt  DO  [facility]   How to contact the [facility] Attending or Consulting provider [place] or covering provider during after hours [place], for this patient?  1. Check the care team in [facility] and look for a) attending/consulting [facility] provider listed and b) the [facility] team listed 2. Log into [email or web address] and use [place]'s universal password to access. If you do not have the password, please contact the hospital operator. 3. Locate the [facility] provider you are looking for under [facility] and page to a number that you can be directly reached. 4. If you still have difficulty reaching the provider, please page the [facility] (Director on Call) for the Hospitalists listed on amion for assistance.  07/02/2019, 1:00 PM

## 2019-07-03 ENCOUNTER — Encounter (HOSPITAL_COMMUNITY): Payer: Self-pay | Admitting: Internal Medicine

## 2019-07-03 ENCOUNTER — Inpatient Hospital Stay (HOSPITAL_COMMUNITY): Payer: Medicare Other

## 2019-07-03 DIAGNOSIS — R509 Fever, unspecified: Secondary | ICD-10-CM

## 2019-07-03 LAB — CBC
HCT: 36.4 % — ABNORMAL LOW (ref 39.0–52.0)
Hemoglobin: 11.9 g/dL — ABNORMAL LOW (ref 13.0–17.0)
MCH: 29.2 pg (ref 26.0–34.0)
MCHC: 32.7 g/dL (ref 30.0–36.0)
MCV: 89.4 fL (ref 80.0–100.0)
Platelets: UNDETERMINED 10*3/uL (ref 150–400)
RBC: 4.07 MIL/uL — ABNORMAL LOW (ref 4.22–5.81)
RDW: 13.6 % (ref 11.5–15.5)
WBC: 7.6 10*3/uL (ref 4.0–10.5)
nRBC: 0 % (ref 0.0–0.2)

## 2019-07-03 LAB — BASIC METABOLIC PANEL
Anion gap: 7 (ref 5–15)
BUN: 14 mg/dL (ref 8–23)
CO2: 27 mmol/L (ref 22–32)
Calcium: 8.2 mg/dL — ABNORMAL LOW (ref 8.9–10.3)
Chloride: 107 mmol/L (ref 98–111)
Creatinine, Ser: 0.96 mg/dL (ref 0.61–1.24)
GFR calc Af Amer: >60 mL/min (ref >60)
GFR calc non Af Amer: >60 mL/min (ref >60)
Glucose, Bld: 88 mg/dL (ref 70–99)
Potassium: 3.5 mmol/L (ref 3.5–5.1)
Sodium: 141 mmol/L (ref 135–145)

## 2019-07-03 LAB — GLUCOSE, CAPILLARY: Glucose-Capillary: 126 mg/dL — ABNORMAL HIGH (ref 70–99)

## 2019-07-03 LAB — CULTURE, BLOOD (ROUTINE X 2)
Special Requests: ADEQUATE
Special Requests: ADEQUATE

## 2019-07-03 MED ORDER — DICLOFENAC SODIUM 1 % EX GEL
2.0000 g | Freq: Four times a day (QID) | CUTANEOUS | Status: DC
  Administered 2019-07-03 – 2019-07-06 (×8): 2 g via TOPICAL
  Filled 2019-07-03: qty 100

## 2019-07-03 NOTE — Progress Notes (Signed)
Progress Note    Jeffrey Underwood  [id number] DOB: [birth year]  DOA: 06/30/2019 PCP: Josetta Huddle, MD    Brief Narrative:   Chief complaint: acute encephalopathy  Medical records reviewed and are as summarized below:  Jeffrey Underwood is a very pleasant 86 y.o. male with a past medical hx of HTN, CAD, MI, BPH melanoma presented 4/8 cc ams. Work up reveals sepsis with blood cultures +gm negative rods, fever, elevated lactic acid, tachycardia.  Provided with IV fluids and antibiotics.   Assessment/Plan:   Principal Problem:   Sepsis ([place]) Active Problems:   Essential hypertension   Acute metabolic encephalopathy   AKI (acute kidney injury) ([place])   Normocytic anemia   Hyperlipidemia   GERD (gastroesophageal reflux disease)   Hypokalemia   Cholelithiasis   Bacteremia  Sepsis with grm negative in blood culture and concern for cholelithiasis.  - Urinalysis did not show clear signs of infection and chest x-ray was otherwise noted to be clear.   -COVID-19 and influenza screening were negative.   -Antibiotics of vancomycin and cefepime started and vanc stopped 4/9 once blood cultures back. -continue cefepime -Culture showing Enterobacter -tylenol for fever -Since source is still unclear will get right upper quadrant ultrasound and most likely HIDA scan  Acute metabolic encephalopathy:  -Improved -Suspect patient's mental status will wax and wane while he is in the hospital  Acute kidney injury:  -Solved  Hypokalemia/hypomagnesemia:  -Repleted  Essential hypertension:  -Resume metoprolol  Cholelithiasis: Patient evaluated at [facility] told last week that he had gallstones, and had been recommended outpatient HIDA scan.  Currently no nausea and patient eating. LFT's within limits of normal. -We will start with ultrasound and move onto HIDA if indicated  Elevated AST, hyperbilirubinemia - LFT's within limits   Cardiac murmur: Patient not noted to have a  murmur previously. -Order echo  Normocytic anemia:  -Trend  Hyperlipidemia -Continue Crestor  Depression -Continue the Effexor and olanzapine  GERD -Continue Pepcid   Family Communication/Anticipated D/C date and plan/Code Status   DVT prophylaxis: Lovenox ordered. Code Status: Full Code.  Family Communication: Daughter, no answer and voicemail full Disposition Plan: From home, lives with daughter: Likely home once sepsis resolved and source of bacteremia found   Medical Consultants:    None.   Anti-Infectives:    Vancomycin 4/8-4/9  Cefepime 4/8>>  Subjective:  Currently eating breakfast with plate 50% consumed Planing of right shoulder pain  Objective:    Vitals:   07/02/19 1953 07/02/19 2347 07/03/19 0434 07/03/19 0900  BP: (!) 165/87 (!) 147/88 (!) 148/80 (!) 144/81  Pulse: 96 89 81 78  Resp: 18 20 18 16   Temp: 98.9 F (37.2 C) 98.8 F (37.1 C) 98.2 F (36.8 C) 98 F (36.7 C)  TempSrc: Oral Oral Oral Oral  SpO2: 97% 94% 98% 97%  Weight:      Height:        Intake/Output Summary (Last 24 hours) at 07/03/2019 1249 Last data filed at 07/03/2019 0900 Gross per 24 hour  Intake 1586.76 ml  Output 625 ml  Net 961.76 ml   Filed Weights   06/30/19 1013  Weight: 68 kg    Exam: In bed, pleasant and cooperative Not in any distress Regular rate and rhythm Diminished breath sounds due to effort No lower extremities edema   Data Reviewed:   I have personally reviewed following labs and imaging studies:  Labs: Labs show the following:   Basic Metabolic Panel: Recent Labs  Lab 06/30/19 1015 06/30/19 1015 07/01/19 0519 07/01/19 0519 07/02/19 0519 07/03/19 0607  NA 132*  --  140  --  141 141  K 2.9*   < > 4.6   < > 3.5 3.5  CL 98  --  105  --  107 107  CO2 21*  --  26  --  24 27  GLUCOSE 169*  --  86  --  88 88  BUN 38*  --  28*  --  20 14  CREATININE 1.56*  --  1.13  --  0.97 0.96  CALCIUM 8.2*  --  8.4*  --  7.8* 8.2*  MG  1.6*  --   --   --   --   --    < > = values in this interval not displayed.   GFR Estimated Creatinine Clearance: 51.2 mL/min (by C-G formula based on SCr of 0.96 mg/dL). Liver Function Tests: Recent Labs  Lab 06/30/19 1015 07/01/19 0519 07/02/19 0519  AST 44* 31 25  ALT 28 23 20   ALKPHOS 113 67 75  BILITOT 1.4* 1.0 0.8  PROT 6.0* 4.9* 5.2*  ALBUMIN 3.1* 2.5* 2.5*   No results for input(s): LIPASE, AMYLASE in the last 168 hours. No results for input(s): AMMONIA in the last 168 hours. Coagulation profile Recent Labs  Lab 06/30/19 1015  INR 1.3*    CBC: Recent Labs  Lab 06/30/19 1015 07/01/19 0519 07/02/19 0519 07/03/19 0607  WBC 7.7 11.0* 7.9 7.6  NEUTROABS 7.3  --   --   --   HGB 12.2* 10.9* 11.6* 11.9*  HCT 36.3* 33.5* 35.0* 36.4*  MCV 88.5 88.9 88.8 89.4  PLT PLATELET CLUMPS NOTED ON SMEAR, UNABLE TO ESTIMATE PLATELET CLUMPS NOTED ON SMEAR, UNABLE TO ESTIMATE PLATELET CLUMPS NOTED ON SMEAR, UNABLE TO ESTIMATE PLATELET CLUMPS NOTED ON SMEAR, UNABLE TO ESTIMATE   Cardiac Enzymes: No results for input(s): CKTOTAL, CKMB, CKMBINDEX, TROPONINI in the last 168 hours. BNP (last 3 results) No results for input(s): PROBNP in the last 8760 hours. CBG: Recent Labs  Lab 06/30/19 1008 07/02/19 0641 07/03/19 1153  GLUCAP 156* 78 126*   D-Dimer: No results for input(s): DDIMER in the last 72 hours. Hgb A1c: No results for input(s): HGBA1C in the last 72 hours. Lipid Profile: No results for input(s): CHOL, HDL, LDLCALC, TRIG, CHOLHDL, LDLDIRECT in the last 72 hours. Thyroid function studies: No results for input(s): TSH, T4TOTAL, T3FREE, THYROIDAB in the last 72 hours.  Invalid input(s): FREET3 Anemia work up: No results for input(s): VITAMINB12, FOLATE, FERRITIN, TIBC, IRON, RETICCTPCT in the last 72 hours. Sepsis Labs: Recent Labs  Lab 06/30/19 1015 06/30/19 1020 06/30/19 1202 06/30/19 1429 06/30/19 1743 07/01/19 0519 07/01/19 0757 07/02/19 0519  07/03/19 0607  WBC 7.7  --   --   --   --  11.0*  --  7.9 7.6  LATICACIDVEN  --    < > 2.8* 2.0* 2.7*  --  1.5  --   --    < > = values in this interval not displayed.    Microbiology Recent Results (from the past 240 hour(s))  Blood Culture (routine x 2)     Status: Abnormal   Collection Time: 06/30/19 10:15 AM   Specimen: BLOOD LEFT FOREARM  Result Value Ref Range Status   Specimen Description BLOOD LEFT FOREARM  Final   Special Requests   Final    BOTTLES DRAWN AEROBIC AND ANAEROBIC Blood Culture adequate volume   Culture  Setup Time   Final    GRAM NEGATIVE RODS IN BOTH AEROBIC AND ANAEROBIC BOTTLES CRITICAL RESULT CALLED TO, READ BACK BY AND VERIFIED WITH: Salli Real [id number] 07/01/2019 Mena Goes Performed at [facility], [address], [place], [place] [address]    Culture ENTEROBACTER AEROGENES (A)  Final   Report Status 07/03/2019 FINAL  Final   Organism ID, Bacteria ENTEROBACTER AEROGENES  Final      Susceptibility   Enterobacter aerogenes - MIC*    CEFAZOLIN >=64 RESISTANT Resistant     CEFEPIME <=0.12 SENSITIVE Sensitive     CEFTAZIDIME <=1 SENSITIVE Sensitive     CEFTRIAXONE <=0.25 SENSITIVE Sensitive     CIPROFLOXACIN <=0.25 SENSITIVE Sensitive     GENTAMICIN <=1 SENSITIVE Sensitive     IMIPENEM 1 SENSITIVE Sensitive     TRIMETH/SULFA <=20 SENSITIVE Sensitive     PIP/TAZO <=4 SENSITIVE Sensitive     * ENTEROBACTER AEROGENES  Urine culture     Status: Abnormal   Collection Time: 06/30/19 10:15 AM   Specimen: In/Out Cath Urine  Result Value Ref Range Status   Specimen Description IN/OUT CATH URINE  Final   Special Requests   Final    NONE Performed at [facility], [place] [address] [place], [place] [address]    Culture (A)  Final    <10,000 COLONIES/mL MULTIPLE SPECIES PRESENT, SUGGEST RECOLLECTION   Report Status 07/01/2019 FINAL  Final  Blood Culture ID Panel (Reflexed)     Status: Abnormal   Collection Time: 06/30/19 10:15 AM  Result  Value Ref Range Status   Enterococcus species NOT DETECTED NOT DETECTED Final   Listeria monocytogenes NOT DETECTED NOT DETECTED Final   Staphylococcus species NOT DETECTED NOT DETECTED Final   Staphylococcus aureus (BCID) NOT DETECTED NOT DETECTED Final   Streptococcus species NOT DETECTED NOT DETECTED Final   Streptococcus agalactiae NOT DETECTED NOT DETECTED Final   Streptococcus pneumoniae NOT DETECTED NOT DETECTED Final   Streptococcus pyogenes NOT DETECTED NOT DETECTED Final   Acinetobacter baumannii NOT DETECTED NOT DETECTED Final   Enterobacteriaceae species DETECTED (A) NOT DETECTED Final    Comment: Enterobacteriaceae represent a large family of gram negative bacteria, not a single organism. Refer to culture for further identification. CRITICAL RESULT CALLED TO, READ BACK BY AND VERIFIED WITH: G. ABBOTT,PHARMD [id number] 07/01/2019 T. TYSOR    Enterobacter cloacae complex NOT DETECTED NOT DETECTED Final   Escherichia coli NOT DETECTED NOT DETECTED Final   Klebsiella oxytoca NOT DETECTED NOT DETECTED Final   Klebsiella pneumoniae NOT DETECTED NOT DETECTED Final   Proteus species NOT DETECTED NOT DETECTED Final   Serratia marcescens NOT DETECTED NOT DETECTED Final   Carbapenem resistance NOT DETECTED NOT DETECTED Final   Haemophilus influenzae NOT DETECTED NOT DETECTED Final   Neisseria meningitidis NOT DETECTED NOT DETECTED Final   Pseudomonas aeruginosa NOT DETECTED NOT DETECTED Final   Candida albicans NOT DETECTED NOT DETECTED Final   Candida glabrata NOT DETECTED NOT DETECTED Final   Candida krusei NOT DETECTED NOT DETECTED Final   Candida parapsilosis NOT DETECTED NOT DETECTED Final   Candida tropicalis NOT DETECTED NOT DETECTED Final    Comment: Performed at [facility], [place] [address], [place], [place] [address]  Blood Culture (routine x 2)     Status: Abnormal   Collection Time: 06/30/19 10:20 AM   Specimen: BLOOD RIGHT HAND  Result Value Ref Range Status    Specimen Description BLOOD RIGHT HAND  Final  Special Requests   Final    BOTTLES DRAWN AEROBIC AND ANAEROBIC Blood Culture adequate volume   Culture  Setup Time   Final    GRAM NEGATIVE RODS IN BOTH AEROBIC AND ANAEROBIC BOTTLES CRITICAL VALUE NOTED.  VALUE IS CONSISTENT WITH PREVIOUSLY REPORTED AND CALLED VALUE.    Culture (A)  Final    ENTEROBACTER AEROGENES SUSCEPTIBILITIES PERFORMED ON PREVIOUS CULTURE WITHIN THE LAST 5 DAYS. Performed at [facility], [place] [address], [place], [place] [address]    Report Status 07/03/2019 FINAL  Final  Respiratory Panel by RT PCR (Flu A&B, Covid) - Nasopharyngeal Swab     Status: None   Collection Time: 06/30/19 11:00 AM   Specimen: Nasopharyngeal Swab  Result Value Ref Range Status   SARS Coronavirus 2 by RT PCR NEGATIVE NEGATIVE Final    Comment: (NOTE) SARS-CoV-2 target nucleic acids are NOT DETECTED. The SARS-CoV-2 RNA is generally detectable in upper respiratoy specimens during the acute phase of infection. The lowest concentration of SARS-CoV-2 viral copies this assay can detect is 131 copies/mL. A negative result does not preclude SARS-Cov-2 infection and should not be used as the sole basis for treatment or other patient management decisions. A negative result may occur with  improper specimen collection/handling, submission of specimen other than nasopharyngeal swab, presence of viral mutation(s) within the areas targeted by this assay, and inadequate number of viral copies (<131 copies/mL). A negative result must be combined with clinical observations, patient history, and epidemiological information. The expected result is Negative. Fact Sheet for Patients:  PinkCheek.be Fact Sheet for Healthcare Providers:  GravelBags.it This test is not yet ap proved or cleared by the [place] FDA and  has been authorized for detection and/or diagnosis of SARS-CoV-2 by FDA  under an Emergency Use Authorization (EUA). This EUA will remain  in effect (meaning this test can be used) for the duration of the COVID-19 declaration under Section 564(b)(1) of the Act, 21 U.S.C. section 360bbb-3(b)(1), unless the authorization is terminated or revoked sooner.    Influenza A by PCR NEGATIVE NEGATIVE Final   Influenza B by PCR NEGATIVE NEGATIVE Final    Comment: (NOTE) The Xpert Xpress SARS-CoV-2/FLU/RSV assay is intended as an aid in  the diagnosis of influenza from Nasopharyngeal swab specimens and  should not be used as a sole basis for treatment. Nasal washings and  aspirates are unacceptable for Xpert Xpress SARS-CoV-2/FLU/RSV  testing. Fact Sheet for Patients: PinkCheek.be Fact Sheet for Healthcare Providers: GravelBags.it This test is not yet approved or cleared by the [place] FDA and  has been authorized for detection and/or diagnosis of SARS-CoV-2 by  FDA under an Emergency Use Authorization (EUA). This EUA will remain  in effect (meaning this test can be used) for the duration of the  Covid-19 declaration under Section 564(b)(1) of the Act, 21  U.S.C. section 360bbb-3(b)(1), unless the authorization is  terminated or revoked. Performed at [facility], [place] [address], [place], [place] [address]     Procedures and diagnostic studies:  ECHOCARDIOGRAM COMPLETE  Result Date: 07/02/2019    ECHOCARDIOGRAM REPORT   Patient Name:   Jeffrey Underwood Date of Exam: 07/02/2019 Medical Rec #:  [id number]      Height:       72.0 in Accession #:    [id number]     Weight:       150.0 lb Date of Birth:  Nov 02, 1928     BSA:  1.885 m Patient Age:    38 years       BP:           139/81 mmHg Patient Gender: M              HR:           88 bpm. Exam Location:  Inpatient Procedure: 2D Echo, Cardiac Doppler and Color Doppler Indications:    R01.1 Murmur  History:        Patient has no prior history of  Echocardiogram examinations.                 Previous Myocardial Infarction, Signs/Symptoms:Murmur and                 Bacteremia; Risk Factors:Hypertension and Dyslipidemia.  Sonographer:    Roseanna Rainbow RDCS Referring Phys: [place] Comments: Technically difficult study due to poor echo windows. Image acquisition challenging due to respiratory motion. IMPRESSIONS  1. Left ventricular ejection fraction, by estimation, is 55 to 60%. The left ventricle has normal function. The left ventricle has no regional wall motion abnormalities. There is severe asymmetric left ventricular hypertrophy. Left ventricular diastolic  parameters are consistent with Grade I diastolic dysfunction (impaired relaxation).  2. Right ventricular systolic function is normal. The right ventricular size is normal.  3. The mitral valve is grossly normal. No evidence of mitral valve regurgitation. No evidence of mitral stenosis.  4. The aortic valve is tricuspid. Aortic valve regurgitation is not visualized. Moderate aortic valve stenosis. FINDINGS  Left Ventricle: Left ventricular ejection fraction, by estimation, is 55 to 60%. The left ventricle has normal function. The left ventricle has no regional wall motion abnormalities. The left ventricular internal cavity size was normal in size. There is  severe asymmetric left ventricular hypertrophy. Left ventricular diastolic parameters are consistent with Grade I diastolic dysfunction (impaired relaxation). Right Ventricle: The right ventricular size is normal. No increase in right ventricular wall thickness. Right ventricular systolic function is normal. Left Atrium: Left atrial size was normal in size. Right Atrium: Right atrial size was normal in size. Pericardium: There is no evidence of pericardial effusion. Mitral Valve: The mitral valve is grossly normal. No evidence of mitral valve regurgitation. No evidence of mitral valve stenosis. Tricuspid Valve: The tricuspid valve  is normal in structure. Tricuspid valve regurgitation is trivial. No evidence of tricuspid stenosis. Aortic Valve: The aortic valve is tricuspid. . There is moderate thickening and moderate calcification of the aortic valve. Aortic valve regurgitation is not visualized. Moderate aortic stenosis is present. Moderate aortic valve annular calcification. There is moderate thickening of the aortic valve. There is moderate calcification of the aortic valve. Aortic valve mean gradient measures 23.6 mmHg. Aortic valve peak gradient measures 41.0 mmHg. Aortic valve area, by VTI measures 1.43 cm. Pulmonic Valve: The pulmonic valve was grossly normal. Pulmonic valve regurgitation is not visualized. No evidence of pulmonic stenosis. Aorta: The aortic root and ascending aorta are structurally normal, with no evidence of dilitation. IAS/Shunts: The atrial septum is grossly normal.  LEFT VENTRICLE PLAX 2D LVIDd:         3.90 cm     Diastology LVIDs:         3.00 cm     LV e' lateral:   7.07 cm/s LV PW:         1.45 cm     LV E/e' lateral: 12.5 LV IVS:        1.70  cm     LV e' medial:    5.33 cm/s LVOT diam:     2.20 cm     LV E/e' medial:  16.5 LV SV:         90 LV SV Index:   48 LVOT Area:     3.80 cm  LV Volumes (MOD) LV vol d, MOD A2C: 69.2 ml LV vol d, MOD A4C: 36.4 ml LV vol s, MOD A2C: 26.9 ml LV vol s, MOD A4C: 14.2 ml LV SV MOD A2C:     42.3 ml LV SV MOD A4C:     36.4 ml LV SV MOD BP:      31.0 ml RIGHT VENTRICLE RV S prime:     10.90 cm/s TAPSE (M-mode): 1.9 cm LEFT ATRIUM             Index       RIGHT ATRIUM           Index LA diam:        3.10 cm 1.64 cm/m  RA Area:     15.70 cm LA Vol (A2C):   37.9 ml 20.10 ml/m RA Volume:   40.40 ml  21.43 ml/m LA Vol (A4C):   23.5 ml 12.46 ml/m LA Biplane Vol: 33.1 ml 17.56 ml/m  AORTIC VALVE AV Area (Vmax):    1.77 cm AV Area (Vmean):   1.57 cm AV Area (VTI):     1.43 cm AV Vmax:           320.00 cm/s AV Vmean:          230.800 cm/s AV VTI:            0.628 m AV Peak  Grad:      41.0 mmHg AV Mean Grad:      23.6 mmHg LVOT Vmax:         149.00 cm/s LVOT Vmean:        95.100 cm/s LVOT VTI:          0.237 m LVOT/AV VTI ratio: 0.38  AORTA Ao Root diam: 3.30 cm MITRAL VALVE MV Area (PHT): 4.15 cm     SHUNTS MV Decel Time: 183 msec     Systemic VTI:  0.24 m MV E velocity: 88.10 cm/s   Systemic Diam: 2.20 cm MV A velocity: 121.00 cm/s MV E/A ratio:  0.73 Mertie Moores MD Electronically signed by Mertie Moores MD Signature Date/Time: 07/02/2019/4:04:24 PM    Final     Medications:   . aspirin  81 mg Oral QHS  . diclofenac Sodium  2 g Topical QID  . enoxaparin (LOVENOX) injection  40 mg Subcutaneous Q24H  . famotidine  20 mg Oral Daily  . metoprolol succinate  50 mg Oral Daily  . midodrine  5 mg Oral BID WC  . OLANZapine  5 mg Oral QHS  . rosuvastatin  10 mg Oral Q M,W,F-2000  . sodium chloride flush  3 mL Intravenous Q12H  . venlafaxine XR  37.5 mg Oral Q breakfast   Continuous Infusions: . sodium chloride 50 mL/hr at 07/02/19 2206  . ceFEPime (MAXIPIME) IV 2 g (07/03/19 1042)     LOS: 3 days   Geradine Girt  DO  [facility]   How to contact the [facility] Attending or Consulting provider [place] or covering provider during after hours [place], for this patient?  1. Check the care team in [facility] and look for a) attending/consulting [place] provider listed and  b) the [facility] team listed 2. Log into [email or web address] and use [place]'s universal password to access. If you do not have the password, please contact the hospital operator. 3. Locate the [facility] provider you are looking for under [facility] and page to a number that you can be directly reached. 4. If you still have difficulty reaching the provider, please page the [facility] (Director on Call) for the Hospitalists listed on amion for assistance.  07/03/2019, 12:49 PM

## 2019-07-03 NOTE — Progress Notes (Signed)
Pharmacy Antibiotic Note  Jeffrey Underwood is a 86 y.o. male admitted on 06/30/2019 with sepsis.  Pharmacy has been consulted for Cefepime dosing.    Blood cultures have been updated to show Enterobacter aerogenes (pan-sensitive except R-Cefazolin). The patient continues on Cefepime which is appropriate and will cover. Given that Enterobacter can produce ampC - would avoid narrowing agents at this time. Renal function is stable and current dosing is appropriate. Will follow along for evaluation of potential sources.   Plan: Continue Cefepime 2gm IV Q12H Follow-up for evaluation of source Will continue to follow renal function, culture results, LOT, and antibiotic de-escalation plans   Height: 6' (182.9 cm) Weight: 68 kg (150 lb) IBW/kg (Calculated) : 77.6  Temp (24hrs), Avg:98.5 F (36.9 C), Min:98 F (36.7 C), Max:98.9 F (37.2 C)  Recent Labs  Lab 06/30/19 1015 06/30/19 1020 06/30/19 1202 06/30/19 1429 06/30/19 1743 07/01/19 0519 07/01/19 0757 07/02/19 0519 07/03/19 0607  WBC 7.7  --   --   --   --  11.0*  --  7.9 7.6  CREATININE 1.56*  --   --   --   --  1.13  --  0.97 0.96  LATICACIDVEN  --  2.5* 2.8* 2.0* 2.7*  --  1.5  --   --     Estimated Creatinine Clearance: 51.2 mL/min (by C-G formula based on SCr of 0.96 mg/dL).    Allergies  Allergen Reactions  . Demerol [Meperidine] Shortness Of Breath and Nausea And Vomiting    Vanc 4/8>>4/9 Cefepime 4/8>>  4/8: UCx: <10,000 colonies 4/8: BC x 2: Enterobacter aerogenes (pan-S except R-cefazolin)  Thank you for allowing pharmacy to be a part of this patient's care.  Alycia Rossetti, PharmD, BCPS Clinical Pharmacist Clinical phone for 07/03/2019: [phone] 07/03/2019 10:10 AM   **Pharmacist phone directory can now be found on [email or web address] ([email or web address]).  Listed under [place].

## 2019-07-03 NOTE — Evaluation (Signed)
Physical Therapy Evaluation Patient Details Name: Jeffrey Underwood MRN: [id number] DOB: 06/06/1928 Today's Date: 07/03/2019   History of Present Illness  Pt is an 86 yo male s/p sepsis, tachycardia, AKI, fever. Pt PMHx: HTN, CAD, MI, BPH, melanoma.  Clinical Impression  Pt admitted with above diagnosis. Comes from home where he lives with his daughter in a single level home with 3 steps to enter; Typically uses a cane to walk indoors and a RW to walk outdoors; Presents to PT with decr activity tolerance, decr functional mobility, suboptimal posture; The pt tells me he has lots of help at home, and that is verifed in the OT evaluation as well;  Pt currently with functional limitations due to the deficits listed below (see PT Problem List). Pt will benefit from skilled PT to increase their independence and safety with mobility to allow discharge to the venue listed below.       Follow Up Recommendations Home health PT;Supervision/Assistance - 24 hour    Equipment Recommendations  Other (comment)(I believe he is already well-equipped)    Recommendations for Other Services       Precautions / Restrictions Precautions Precautions: Fall      Mobility  Bed Mobility Overal bed mobility: Needs Assistance Bed Mobility: Supine to Sit     Supine to sit: Min assist     General bed mobility comments: Min handheld assist to pull to sit  Transfers Overall transfer level: Needs assistance Equipment used: Rolling walker (2 wheeled) Transfers: Sit to/from Stand Sit to Stand: Min assist         General transfer comment: Min assist to power up from relatively low bed height  Ambulation/Gait Ambulation/Gait assistance: Min guard Gait Distance (Feet): 100 Feet Assistive device: Rolling walker (2 wheeled) Gait Pattern/deviations: Step-through pattern;Trunk flexed Gait velocity: slowed   General Gait Details: Mildly unsteady, but with good use of RW for support; at times, in particular  with turns, his feet were outside of the RW; natable fatigue at end of walk  Stairs            Wheelchair Mobility    Modified Rankin (Stroke Patients Only)       Balance Overall balance assessment: Mild deficits observed, not formally tested                                           Pertinent Vitals/Pain Pain Assessment: 0-10 Pain Score: 0-No pain    Home Living Family/patient expects to be discharged to:: Private residence Living Arrangements: Children Available Help at Discharge: Family;Available 24 hours/day Type of Home: House Home Access: Stairs to enter Entrance Stairs-Rails: Right Entrance Stairs-Number of Steps: 3 Home Layout: One level Home Equipment: Walker - 4 wheels;Cane - single point;Bedside commode;Grab bars - toilet;Grab bars - tub/shower;Hand held shower head;Hospital bed      Prior Function Level of Independence: Needs assistance   Gait / Transfers Assistance Needed: SPC for mobility indoors; outdoors using rollator  ADL's / Homemaking Assistance Needed: grooming/bathe/dress with independence; fixes sandwiches; IADLs performed by children        Hand Dominance   Dominant Hand: Right    Extremity/Trunk Assessment   Upper Extremity Assessment Upper Extremity Assessment: Defer to OT evaluation    Lower Extremity Assessment Lower Extremity Assessment: Generalized weakness    Cervical / Trunk Assessment Cervical / Trunk Assessment: Kyphotic  Communication   Communication: No  difficulties  Cognition Arousal/Alertness: Awake/alert Behavior During Therapy: WFL for tasks assessed/performed Overall Cognitive Status: Impaired/Different from baseline Area of Impairment: Memory;Safety/judgement;Awareness;Problem solving;Following commands                     Memory: Decreased short-term memory         General Comments: Pt A/O x4; pt following 1 step commands with increased time.       General Comments  General comments (skin integrity, edema, etc.): No dyspnea noted with amb on Room Air    Exercises     Assessment/Plan    PT Assessment Patient needs continued PT services  PT Problem List Decreased strength;Decreased range of motion;Decreased activity tolerance;Decreased balance;Decreased mobility;Decreased coordination;Decreased cognition;Decreased knowledge of use of DME;Decreased safety awareness;Decreased knowledge of precautions       PT Treatment Interventions DME instruction;Gait training;Stair training;Functional mobility training;Therapeutic activities;Therapeutic exercise;Balance training;Patient/family education    PT Goals (Current goals can be found in the Care Plan section)  Acute Rehab PT Goals Patient Stated Goal: Did not state, but agreeable to amb in hallways PT Goal Formulation: Patient unable to participate in goal setting Time For Goal Achievement: 07/17/19 Potential to Achieve Goals: Good    Frequency Min 3X/week   Barriers to discharge        Co-evaluation               AM-PAC PT "6 Clicks" Mobility  Outcome Measure Help needed turning from your back to your side while in a flat bed without using bedrails?: None Help needed moving from lying on your back to sitting on the side of a flat bed without using bedrails?: A Little Help needed moving to and from a bed to a chair (including a wheelchair)?: A Little Help needed standing up from a chair using your arms (e.g., wheelchair or bedside chair)?: A Little Help needed to walk in hospital room?: A Little Help needed climbing 3-5 steps with a railing? : A Lot 6 Click Score: 18    End of Session Equipment Utilized During Treatment: Gait belt Activity Tolerance: Patient tolerated treatment well Patient left: in chair;with call bell/phone within reach;with chair alarm set Nurse Communication: Mobility status PT Visit Diagnosis: Unsteadiness on feet (R26.81);Other abnormalities of gait and mobility  (R26.89)    Time: 1005-1025 PT Time Calculation (min) (ACUTE ONLY): 20 min   Charges:   PT Evaluation $PT Eval Moderate Complexity: 1 Mod          Roney [place], [place]  Acute Rehabilitation Services Pager [phone] Office [phone]   Colletta [place] 07/03/2019, 1:24 PM

## 2019-07-03 NOTE — Plan of Care (Signed)
°  Problem: Respiratory: °Goal: Ability to maintain adequate ventilation will improve °Outcome: Progressing °  °

## 2019-07-04 ENCOUNTER — Inpatient Hospital Stay (HOSPITAL_COMMUNITY): Payer: Medicare Other

## 2019-07-04 ENCOUNTER — Encounter (HOSPITAL_COMMUNITY): Payer: Self-pay | Admitting: Internal Medicine

## 2019-07-04 LAB — SURGICAL PCR SCREEN
MRSA, PCR: POSITIVE — AB
Staphylococcus aureus: POSITIVE — AB

## 2019-07-04 LAB — GLUCOSE, CAPILLARY: Glucose-Capillary: 120 mg/dL — ABNORMAL HIGH (ref 70–99)

## 2019-07-04 MED ORDER — MUPIROCIN 2 % EX OINT
1.0000 "application " | TOPICAL_OINTMENT | Freq: Two times a day (BID) | CUTANEOUS | Status: DC
  Administered 2019-07-04 – 2019-07-07 (×6): 1 via NASAL
  Filled 2019-07-04 (×3): qty 22

## 2019-07-04 MED ORDER — CHLORHEXIDINE GLUCONATE CLOTH 2 % EX PADS
6.0000 | MEDICATED_PAD | Freq: Every day | CUTANEOUS | Status: DC
  Administered 2019-07-05 – 2019-07-07 (×3): 6 via TOPICAL

## 2019-07-04 MED ORDER — TECHNETIUM TC 99M MEBROFENIN IV KIT
5.4000 | PACK | Freq: Once | INTRAVENOUS | Status: AC | PRN
  Administered 2019-07-04: 5.4 via INTRAVENOUS

## 2019-07-04 NOTE — Progress Notes (Signed)
Progress Note    Jeffrey Underwood  [id number] DOB: March 05, 1929  DOA: 06/30/2019 PCP: Josetta Huddle, MD    Brief Narrative:   Chief complaint: acute encephalopathy  Medical records reviewed and are as summarized below:  Jeffrey Underwood is a very pleasant 86 y.o. male with a past medical hx of HTN, CAD, MI, BPH melanoma presented 4/8 cc ams. Work up reveals sepsis with blood cultures +gm negative rods, fever, elevated lactic acid, tachycardia.  Provided with IV fluids and antibiotics.   Assessment/Plan:   Principal Problem:   Sepsis ([place]) Active Problems:   Essential hypertension   Acute metabolic encephalopathy   AKI (acute kidney injury) ([place])   Normocytic anemia   Hyperlipidemia   GERD (gastroesophageal reflux disease)   Hypokalemia   Cholelithiasis   Bacteremia  Sepsis with grm negative in blood culture and concern for cholelithiasis.  - Urinalysis did not show clear signs of infection and chest x-ray was otherwise noted to be clear.   -COVID-19 and influenza screening were negative.   -Antibiotics of vancomycin and cefepime started and vanc stopped 4/9 once blood cultures back. -continue cefepime -Culture showing Enterobacter -tylenol for fever -Since source is still unclear: HIDA scan pending  Acute metabolic encephalopathy:  -Improved -Suspect patient's mental status will wax and wane while he is in the hospital  Acute kidney injury:  -reSolved  Hypokalemia/hypomagnesemia:  -Repleted  Essential hypertension:  -Resume metoprolol  Cholelithiasis: Patient evaluated at [facility] told last week that he had gallstones, and had been recommended outpatient HIDA scan.  Currently no nausea and patient eating. LFT's within limits of normal.  Elevated AST, hyperbilirubinemia - LFT's within limits   Cardiac murmur: Patient not noted to have a murmur previously. -echo with preserved EF  Normocytic anemia:  -Trend  Hyperlipidemia -Continue  Crestor  Depression -Continue the Effexor and olanzapine  GERD -Continue Pepcid   Family Communication/Anticipated D/C date and plan/Code Status   DVT prophylaxis: Lovenox ordered. Code Status: Full Code.  Family Communication: spoke with daughter on 4/11-- will call with HIDA results Disposition Plan: From home, lives with daughter: Likely home once sepsis resolved and source of bacteremia found   Medical Consultants:    None.   Anti-Infectives:    Vancomycin 4/8-4/9  Cefepime 4/8>>  Subjective:  Shoulder pain better with Voltaren gel  Objective:    Vitals:   07/03/19 1653 07/03/19 2146 07/04/19 0601 07/04/19 0855  BP: 138/68 (!) 163/79 (!) 159/89 (!) 155/82  Pulse: 79 83 80 89  Resp: 18 18 18 18   Temp: 99.6 F (37.6 C) 99.1 F (37.3 C) 98.2 F (36.8 C) 98 F (36.7 C)  TempSrc: Oral Oral Oral Oral  SpO2: 96% 93% 96% 98%  Weight:  70.5 kg    Height:        Intake/Output Summary (Last 24 hours) at 07/04/2019 1508 Last data filed at 07/04/2019 0940 Gross per 24 hour  Intake 240 ml  Output 1100 ml  Net -860 ml   Filed Weights   06/30/19 1013 07/03/19 2146  Weight: 68 kg 70.5 kg    Exam: Pleasant and cooperative Sleeping soundly Regular rate and rhythm Positive bowel sounds, soft, nontender No lower extremity edema   Data Reviewed:   I have personally reviewed following labs and imaging studies:  Labs: Labs show the following:   Basic Metabolic Panel: Recent Labs  Lab 06/30/19 1015 06/30/19 1015 07/01/19 0519 07/01/19 0519 07/02/19 0519 07/03/19 0607  NA 132*  --  140  --  141 141  K 2.9*   < > 4.6   < > 3.5 3.5  CL 98  --  105  --  107 107  CO2 21*  --  26  --  24 27  GLUCOSE 169*  --  86  --  88 88  BUN 38*  --  28*  --  20 14  CREATININE 1.56*  --  1.13  --  0.97 0.96  CALCIUM 8.2*  --  8.4*  --  7.8* 8.2*  MG 1.6*  --   --   --   --   --    < > = values in this interval not displayed.   GFR Estimated Creatinine  Clearance: 53 mL/min (by C-G formula based on SCr of 0.96 mg/dL). Liver Function Tests: Recent Labs  Lab 06/30/19 1015 07/01/19 0519 07/02/19 0519  AST 44* 31 25  ALT 28 23 20   ALKPHOS 113 67 75  BILITOT 1.4* 1.0 0.8  PROT 6.0* 4.9* 5.2*  ALBUMIN 3.1* 2.5* 2.5*   No results for input(s): LIPASE, AMYLASE in the last 168 hours. No results for input(s): AMMONIA in the last 168 hours. Coagulation profile Recent Labs  Lab 06/30/19 1015  INR 1.3*    CBC: Recent Labs  Lab 06/30/19 1015 07/01/19 0519 07/02/19 0519 07/03/19 0607  WBC 7.7 11.0* 7.9 7.6  NEUTROABS 7.3  --   --   --   HGB 12.2* 10.9* 11.6* 11.9*  HCT 36.3* 33.5* 35.0* 36.4*  MCV 88.5 88.9 88.8 89.4  PLT PLATELET CLUMPS NOTED ON SMEAR, UNABLE TO ESTIMATE PLATELET CLUMPS NOTED ON SMEAR, UNABLE TO ESTIMATE PLATELET CLUMPS NOTED ON SMEAR, UNABLE TO ESTIMATE PLATELET CLUMPS NOTED ON SMEAR, UNABLE TO ESTIMATE   Cardiac Enzymes: No results for input(s): CKTOTAL, CKMB, CKMBINDEX, TROPONINI in the last 168 hours. BNP (last 3 results) No results for input(s): PROBNP in the last 8760 hours. CBG: Recent Labs  Lab 06/30/19 1008 07/02/19 0641 07/03/19 1153 07/03/19 1634  GLUCAP 156* 78 126* 120*   D-Dimer: No results for input(s): DDIMER in the last 72 hours. Hgb A1c: No results for input(s): HGBA1C in the last 72 hours. Lipid Profile: No results for input(s): CHOL, HDL, LDLCALC, TRIG, CHOLHDL, LDLDIRECT in the last 72 hours. Thyroid function studies: No results for input(s): TSH, T4TOTAL, T3FREE, THYROIDAB in the last 72 hours.  Invalid input(s): FREET3 Anemia work up: No results for input(s): VITAMINB12, FOLATE, FERRITIN, TIBC, IRON, RETICCTPCT in the last 72 hours. Sepsis Labs: Recent Labs  Lab 06/30/19 1015 06/30/19 1020 06/30/19 1202 06/30/19 1429 06/30/19 1743 07/01/19 0519 07/01/19 0757 07/02/19 0519 07/03/19 0607  WBC 7.7  --   --   --   --  11.0*  --  7.9 7.6  LATICACIDVEN  --    < > 2.8*  2.0* 2.7*  --  1.5  --   --    < > = values in this interval not displayed.    Microbiology Recent Results (from the past 240 hour(s))  Blood Culture (routine x 2)     Status: Abnormal   Collection Time: 06/30/19 10:15 AM   Specimen: BLOOD LEFT FOREARM  Result Value Ref Range Status   Specimen Description BLOOD LEFT FOREARM  Final   Special Requests   Final    BOTTLES DRAWN AEROBIC AND ANAEROBIC Blood Culture adequate volume   Culture  Setup Time   Final    GRAM NEGATIVE RODS IN BOTH AEROBIC AND ANAEROBIC BOTTLES CRITICAL RESULT  CALLED TO, READ BACK BY AND VERIFIED WITH: Salli Real [id number] 07/01/2019 Mena Goes Performed at [facility], [place] [address], [place], [place] [address]    Culture ENTEROBACTER AEROGENES (A)  Final   Report Status 07/03/2019 FINAL  Final   Organism ID, Bacteria ENTEROBACTER AEROGENES  Final      Susceptibility   Enterobacter aerogenes - MIC*    CEFAZOLIN >=64 RESISTANT Resistant     CEFEPIME <=0.12 SENSITIVE Sensitive     CEFTAZIDIME <=1 SENSITIVE Sensitive     CEFTRIAXONE <=0.25 SENSITIVE Sensitive     CIPROFLOXACIN <=0.25 SENSITIVE Sensitive     GENTAMICIN <=1 SENSITIVE Sensitive     IMIPENEM 1 SENSITIVE Sensitive     TRIMETH/SULFA <=20 SENSITIVE Sensitive     PIP/TAZO <=4 SENSITIVE Sensitive     * ENTEROBACTER AEROGENES  Urine culture     Status: Abnormal   Collection Time: 06/30/19 10:15 AM   Specimen: In/Out Cath Urine  Result Value Ref Range Status   Specimen Description IN/OUT CATH URINE  Final   Special Requests   Final    NONE Performed at [facility], [place] [address], [place], [place] [address]    Culture (A)  Final    <10,000 COLONIES/mL MULTIPLE SPECIES PRESENT, SUGGEST RECOLLECTION   Report Status 07/01/2019 FINAL  Final  Blood Culture ID Panel (Reflexed)     Status: Abnormal   Collection Time: 06/30/19 10:15 AM  Result Value Ref Range Status   Enterococcus species NOT DETECTED NOT DETECTED Final   Listeria  monocytogenes NOT DETECTED NOT DETECTED Final   Staphylococcus species NOT DETECTED NOT DETECTED Final   Staphylococcus aureus (BCID) NOT DETECTED NOT DETECTED Final   Streptococcus species NOT DETECTED NOT DETECTED Final   Streptococcus agalactiae NOT DETECTED NOT DETECTED Final   Streptococcus pneumoniae NOT DETECTED NOT DETECTED Final   Streptococcus pyogenes NOT DETECTED NOT DETECTED Final   Acinetobacter baumannii NOT DETECTED NOT DETECTED Final   Enterobacteriaceae species DETECTED (A) NOT DETECTED Final    Comment: Enterobacteriaceae represent a large family of gram negative bacteria, not a single organism. Refer to culture for further identification. CRITICAL RESULT CALLED TO, READ BACK BY AND VERIFIED WITH: G. ABBOTT,PHARMD [id number] 07/01/2019 T. TYSOR    Enterobacter cloacae complex NOT DETECTED NOT DETECTED Final   Escherichia coli NOT DETECTED NOT DETECTED Final   Klebsiella oxytoca NOT DETECTED NOT DETECTED Final   Klebsiella pneumoniae NOT DETECTED NOT DETECTED Final   Proteus species NOT DETECTED NOT DETECTED Final   Serratia marcescens NOT DETECTED NOT DETECTED Final   Carbapenem resistance NOT DETECTED NOT DETECTED Final   Haemophilus influenzae NOT DETECTED NOT DETECTED Final   Neisseria meningitidis NOT DETECTED NOT DETECTED Final   Pseudomonas aeruginosa NOT DETECTED NOT DETECTED Final   Candida albicans NOT DETECTED NOT DETECTED Final   Candida glabrata NOT DETECTED NOT DETECTED Final   Candida krusei NOT DETECTED NOT DETECTED Final   Candida parapsilosis NOT DETECTED NOT DETECTED Final   Candida tropicalis NOT DETECTED NOT DETECTED Final    Comment: Performed at [facility], [place] [address], [place], [place] [address]  Blood Culture (routine x 2)     Status: Abnormal   Collection Time: 06/30/19 10:20 AM   Specimen: BLOOD RIGHT HAND  Result Value Ref Range Status   Specimen Description BLOOD RIGHT HAND  Final   Special Requests   Final    BOTTLES DRAWN  AEROBIC AND ANAEROBIC Blood Culture adequate volume  Culture  Setup Time   Final    GRAM NEGATIVE RODS IN BOTH AEROBIC AND ANAEROBIC BOTTLES CRITICAL VALUE NOTED.  VALUE IS CONSISTENT WITH PREVIOUSLY REPORTED AND CALLED VALUE.    Culture (A)  Final    ENTEROBACTER AEROGENES SUSCEPTIBILITIES PERFORMED ON PREVIOUS CULTURE WITHIN THE LAST 5 DAYS. Performed at [facility], [place] [address] [place], [place] [address]    Report Status 07/03/2019 FINAL  Final  Respiratory Panel by RT PCR (Flu A&B, Covid) - Nasopharyngeal Swab     Status: None   Collection Time: 06/30/19 11:00 AM   Specimen: Nasopharyngeal Swab  Result Value Ref Range Status   SARS Coronavirus 2 by RT PCR NEGATIVE NEGATIVE Final    Comment: (NOTE) SARS-CoV-2 target nucleic acids are NOT DETECTED. The SARS-CoV-2 RNA is generally detectable in upper respiratoy specimens during the acute phase of infection. The lowest concentration of SARS-CoV-2 viral copies this assay can detect is 131 copies/mL. A negative result does not preclude SARS-Cov-2 infection and should not be used as the sole basis for treatment or other patient management decisions. A negative result may occur with  improper specimen collection/handling, submission of specimen other than nasopharyngeal swab, presence of viral mutation(s) within the areas targeted by this assay, and inadequate number of viral copies (<131 copies/mL). A negative result must be combined with clinical observations, patient history, and epidemiological information. The expected result is Negative. Fact Sheet for Patients:  PinkCheek.be Fact Sheet for Healthcare Providers:  GravelBags.it This test is not yet ap proved or cleared by the [place] FDA and  has been authorized for detection and/or diagnosis of SARS-CoV-2 by FDA under an Emergency Use Authorization (EUA). This EUA will remain  in effect (meaning this  test can be used) for the duration of the COVID-19 declaration under Section 564(b)(1) of the Act, 21 U.S.C. section 360bbb-3(b)(1), unless the authorization is terminated or revoked sooner.    Influenza A by PCR NEGATIVE NEGATIVE Final   Influenza B by PCR NEGATIVE NEGATIVE Final    Comment: (NOTE) The Xpert Xpress SARS-CoV-2/FLU/RSV assay is intended as an aid in  the diagnosis of influenza from Nasopharyngeal swab specimens and  should not be used as a sole basis for treatment. Nasal washings and  aspirates are unacceptable for Xpert Xpress SARS-CoV-2/FLU/RSV  testing. Fact Sheet for Patients: PinkCheek.be Fact Sheet for Healthcare Providers: GravelBags.it This test is not yet approved or cleared by the [place] FDA and  has been authorized for detection and/or diagnosis of SARS-CoV-2 by  FDA under an Emergency Use Authorization (EUA). This EUA will remain  in effect (meaning this test can be used) for the duration of the  Covid-19 declaration under Section 564(b)(1) of the Act, 21  U.S.C. section 360bbb-3(b)(1), unless the authorization is  terminated or revoked. Performed at [facility], [place] [address], [place],  [address]     Procedures and diagnostic studies:  US Abdomen Limited  Result Date: 07/03/2019 CLINICAL DATA:  Gram-negative bacteremia EXAM: ULTRASOUND ABDOMEN LIMITED RIGHT UPPER QUADRANT COMPARISON:  CT abdomen/pelvis dated 06/26/2019 FINDINGS: Gallbladder: Surgically absent. Common bile duct: Diameter: Not discretely visualized. Liver: Poorly visualized. At the upper limits of normal for parenchymal echogenicity. No focal hepatic lesion is seen. Portal vein is patent on color Doppler imaging with normal direction of blood flow towards the liver. Other: None. IMPRESSION: Limited evaluation. Liver is poorly visualized but grossly unremarkable. Status post cholecystectomy. Electronically Signed    By: Henderson Newcomer.D.  On: 07/03/2019 20:21    Medications:   . aspirin  81 mg Oral QHS  . diclofenac Sodium  2 g Topical QID  . enoxaparin (LOVENOX) injection  40 mg Subcutaneous Q24H  . famotidine  20 mg Oral Daily  . metoprolol succinate  50 mg Oral Daily  . midodrine  5 mg Oral BID WC  . OLANZapine  5 mg Oral QHS  . rosuvastatin  10 mg Oral Q M,W,F-2000  . sodium chloride flush  3 mL Intravenous Q12H  . venlafaxine XR  37.5 mg Oral Q breakfast   Continuous Infusions: . sodium chloride 50 mL/hr at 07/03/19 2134  . ceFEPime (MAXIPIME) IV 2 g (07/04/19 1443)     LOS: 4 days   Geradine Girt  DO  [facility]   How to contact the [facility] Attending or Consulting provider [place] or covering provider during after hours [place], for this patient?  1. Check the care team in [facility] and look for a) attending/consulting [facility] provider listed and b) the [facility] team listed 2. Log into [email or web address] and use [facility]'s universal password to access. If you do not have the password, please contact the hospital operator. 3. Locate the [facility] provider you are looking for under [facility] and page to a number that you can be directly reached. 4. If you still have difficulty reaching the provider, please page the [facility] (Director on Call) for the Hospitalists listed on amion for assistance.  07/04/2019, 3:08 PM

## 2019-07-04 NOTE — Progress Notes (Signed)
Physical Therapy Treatment Patient Details Name: Jeffrey Underwood MRN: [id number] DOB: [birth year] Today's Date: 07/04/2019    History of Present Illness Pt is an 86 yo male s/p sepsis, tachycardia, AKI, fever. Pt PMHx: HTN, CAD, MI, BPH, melanoma.    PT Comments    Continuing work on functional mobility and activity tolerance;  Walked the hallways again this afternoon with RW and minguard assist, as well as cues to self-monitor for activity tolerance; noted HR as high as 134bpm with walking, coupled with fatigue; Sat to rest, and then he was able to proceed back to his room with OT assist; Noted likely for lap chole tomorrow   Follow Up Recommendations  Home health PT;Supervision/Assistance - 24 hour     Equipment Recommendations  Other (comment)(I believe he is already well-equipped)    Recommendations for Other Services       Precautions / Restrictions Precautions Precautions: Fall Restrictions Weight Bearing Restrictions: No    Mobility  Bed Mobility Overal bed mobility: Needs Assistance Bed Mobility: Supine to Sit     Supine to sit: Min guard     General bed mobility comments: Minguard for lines; no physical assist needed  Transfers Overall transfer level: Needs assistance Equipment used: Rolling walker (2 wheeled) Transfers: Sit to/from Stand Sit to Stand: Min assist         General transfer comment: Min assist to power up from relatively low bed height, and from transport wheelchair  Ambulation/Gait Ambulation/Gait assistance: Min guard Gait Distance (Feet): 140 Feet Assistive device: Rolling walker (2 wheeled) Gait Pattern/deviations: Step-through pattern;Trunk flexed     General Gait Details: Walked on Room air with good use of RW for support; cues for more upright posture, and he is able to extend upper trunk, but sinks back into flexed posture within seconds   Stairs             Wheelchair Mobility    Modified Rankin (Stroke Patients  Only)       Balance                                            Cognition Arousal/Alertness: Awake/alert Behavior During Therapy: WFL for tasks assessed/performed Overall Cognitive Status: Within Functional Limits for tasks assessed(for simple mobiltiy tasks)                                        Exercises      General Comments General comments (skin integrity, edema, etc.): On the way back to the room, pt reported fatigue and asked to sit; noted HR had reached 134; original O2 sat reading once seated was low, but unsure of accuracy of reading      Pertinent Vitals/Pain Pain Assessment: 0-10 Pain Score: 0-No pain    Home Living                      Prior Function            PT Goals (current goals can now be found in the care plan section) Acute Rehab PT Goals Patient Stated Goal: Did not state, but agreeable to amb in hallways PT Goal Formulation: Patient unable to participate in goal setting Time For Goal Achievement: 07/17/19 Potential to Achieve Goals: Good Progress towards PT goals:  Progressing toward goals    Frequency    Min 3X/week      PT Plan Current plan remains appropriate    Co-evaluation              AM-PAC PT "6 Clicks" Mobility   Outcome Measure  Help needed turning from your back to your side while in a flat bed without using bedrails?: None Help needed moving from lying on your back to sitting on the side of a flat bed without using bedrails?: A Little Help needed moving to and from a bed to a chair (including a wheelchair)?: A Little Help needed standing up from a chair using your arms (e.g., wheelchair or bedside chair)?: A Little Help needed to walk in hospital room?: A Little Help needed climbing 3-5 steps with a railing? : A Lot 6 Click Score: 18    End of Session Equipment Utilized During Treatment: Gait belt Activity Tolerance: Patient tolerated treatment well Patient left:  Other (comment)(in hallway, starting work with OT) Nurse Communication: Mobility status(and noted fever blister lower lip) PT Visit Diagnosis: Unsteadiness on feet (R26.81);Other abnormalities of gait and mobility (R26.89)     Time: 1600-1630 PT Time Calculation (min) (ACUTE ONLY): 30 min  Charges:  $Gait Training: 23-37 mins                     Roney Marion, [place]  Acute Rehabilitation Services Pager [phone] Office [place] 07/04/2019, 6:24 PM

## 2019-07-04 NOTE — Consult Note (Signed)
Jeffrey Underwood [birth year]  [id number].    Requesting MD: Dr. Eulogio Bear Chief Complaint/Reason for Consult: cholecystitis  HPI:  This is an 86 yo white male with a history of HTN, CAD, h/o MI s/p stent, BPH, and melanoma who lives at home with one of his daughters.  She found him confused last Thursday morning with rigors.  EMS was called and he was taken to [facility].  Of note the week before, he was seen by [facility] as well as his PCP for epigastric abdominal pain and found to have gallstones as well as nausea. His daughter reports he has had 2 similar episodes of pain in the past and found to have gallstones then, but was never sick from it like this most recent episode. An outpatient HIDA scan was ordered.  He never made it to this scan as he got sick prior to this being done.  At [facility] on 4/8, he was found to have a temp of 103.5 with + blood cultures showing Enterobacter.  His WBC was normal and LFTS were also normal.  He was started on vanc and cefepime.  He denied any abdominal pain during this admission; however, given other source for gram negative bacteremia found, he underwent a HIDA scan which was positive for nonvisualization of the gallbladder.  His fevers have resolved, he is eating, and denies abdominal pain.  However, given his HIDA scan findings as well as his bacteremia, we were asked to see him for possible cholecystectomy. Past abdominal surgery includes inguinal hernia repair. He takes 81 mg ASA at home daily but not other blood thinning medications. He lives at home with his two daughters and is fairly independent.   ROS: ROS Please see HPI, otherwise all other systems have been reviewed and are negative.   Family History  Problem Relation Age of Onset  . CVA Sister     Past Medical History:  Diagnosis Date  . B12 deficiency   . Bacteremia   . BPH (benign prostatic hyperplasia)    w/ urgency  . Coronary artery disease   . Degenerative  cervical disc   . Diabetes mellitus without complication ([place])    0000000  . Hypertension   . Insomnia   . Melanoma ([place])   . Myocardial infarction ([place])   . Pneumonia   . Vitamin D deficiency     Past Surgical History:  Procedure Laterality Date  . HEMORRHOID SURGERY    . HERNIA REPAIR     L inguinal  . melanoma removal    . ROTATOR CUFF REPAIR     R    Social History:  reports that he quit smoking about 30 years ago. He does not have any smokeless tobacco history on file. He reports that he does not use drugs. No history on file for alcohol.  Allergies:  Allergies  Allergen Reactions  . Demerol [Meperidine] Shortness Of Breath and Nausea And Vomiting    Medications Prior to Admission  Medication Sig Dispense Refill  . aspirin 81 MG tablet Take 81 mg by mouth at bedtime.     . Camphor-Menthol (TIGER BALM EXTRA STRENGTH) 11-10 % OINT Apply 1 application topically daily as needed (shoulder pain).     . cholecalciferol (VITAMIN D) 1000 UNITS tablet Take 1,000 Units by mouth daily.    . cyanocobalamin 1000 MCG tablet Take 1,000 mcg by mouth at bedtime.    . famotidine (PEPCID) 20 MG tablet Take 20 mg by mouth 2 (  two) times daily.     . Melatonin 200 MCG TABS Take 1 tablet by mouth at bedtime as needed (sleep). REMfresh    . metoprolol succinate (TOPROL-XL) 50 MG 24 hr tablet Take 50 mg by mouth daily. Take with or immediately following a meal. Takes 1/2 tablet daily    . midodrine (PROAMATINE) 5 MG tablet Take 5 mg by mouth in the morning and at bedtime.    . nitroGLYCERIN (NITROSTAT) 0.4 MG SL tablet Place 0.4 mg under the tongue every 5 (five) minutes as needed for chest pain. Reported on 06/13/2015    . OLANZapine (ZYPREXA) 5 MG tablet Take 5 mg by mouth at bedtime.     . Omega-3 Krill Oil 300 MG CAPS Take 300 mg by mouth daily.    . polyethylene glycol (MIRALAX / GLYCOLAX) packet Take 17 g by mouth daily as needed for mild constipation.     . Red Yeast Rice 600 MG CAPS Take  600 mg by mouth daily.     . rosuvastatin (CRESTOR) 10 MG tablet Take 10 mg by mouth 3 (three) times a week. Monday, Wednesday, and Friday at bedtime    . Saw Palmetto, Serenoa repens, (SAW PALMETTO PO) Take 1 tablet by mouth daily.    Marland Kitchen venlafaxine XR (EFFEXOR-XR) 37.5 MG 24 hr capsule Take 37.5 mg by mouth daily.       Physical Exam: Blood pressure (!) 155/82, pulse 89, temperature 98 F (36.7 C), temperature source Oral, resp. rate 18, height 6' (1.829 m), weight 70.5 kg, SpO2 98 %. General: pleasant, WD, WN white male who is laying in bed in NAD HEENT:  Sclera are anicteric.  PERRL.  Ears and nose without any masses or lesions.  Mouth is pink and moist Heart: regular, rate, and rhythm. Systolic murmur heard.  Palpable radial and pedal pulses bilaterally Lungs: CTAB, no wheezes, rhonchi, or rales noted.  Respiratory effort nonlabored Abd: soft, NT, ND, +BS, no masses, hernias, or organomegaly MS: all 4 extremities are symmetrical with no cyanosis, clubbing, or edema. Skin: warm and dry with no masses, lesions, or rashes Neuro: Cranial nerves 2-12 grossly intact, sensation is normal throughout Psych: A&Ox3 with an appropriate affect.   Results for orders placed or performed during the hospital encounter of 06/30/19 (from the past 48 hour(s))  CBC     Status: Abnormal   Collection Time: 07/03/19  6:07 AM  Result Value Ref Range   WBC 7.6 4.0 - 10.5 K/uL   RBC 4.07 (L) 4.22 - 5.81 MIL/uL   Hemoglobin 11.9 (L) 13.0 - 17.0 g/dL   HCT 36.4 (L) 39.0 - 52.0 %   MCV 89.4 80.0 - 100.0 fL   MCH 29.2 26.0 - 34.0 pg   MCHC 32.7 30.0 - 36.0 g/dL   RDW 13.6 11.5 - 15.5 %   Platelets PLATELET CLUMPS NOTED ON SMEAR, UNABLE TO ESTIMATE 150 - 400 K/uL    Comment: PLATELETS APPEAR DECREASED   nRBC 0.0 0.0 - 0.2 %    Comment: Performed at [facility], [place] [address], [place], [place] [id number]  Basic metabolic panel     Status: Abnormal   Collection Time: 07/03/19  6:07 AM  Result Value  Ref Range   Sodium 141 135 - 145 mmol/L   Potassium 3.5 3.5 - 5.1 mmol/L   Chloride 107 98 - 111 mmol/L   CO2 27 22 - 32 mmol/L   Glucose, Bld 88 70 - 99 mg/dL  Comment: Glucose reference range applies only to samples taken after fasting for at least 8 hours.   BUN 14 8 - 23 mg/dL   Creatinine, Ser 0.96 0.61 - 1.24 mg/dL   Calcium 8.2 (L) 8.9 - 10.3 mg/dL   GFR calc non Af Amer >60 >60 mL/min   GFR calc Af Amer >60 >60 mL/min   Anion gap 7 5 - 15    Comment: Performed at [facility] [address], [place], [place] [address]  Glucose, capillary     Status: Abnormal   Collection Time: 07/03/19 11:53 AM  Result Value Ref Range   Glucose-Capillary 126 (H) 70 - 99 mg/dL    Comment: Glucose reference range applies only to samples taken after fasting for at least 8 hours.  Glucose, capillary     Status: Abnormal   Collection Time: 07/03/19  4:34 PM  Result Value Ref Range   Glucose-Capillary 120 (H) 70 - 99 mg/dL    Comment: Glucose reference range applies only to samples taken after fasting for at least 8 hours.   NM Hepato W/EF  Result Date: 07/04/2019 CLINICAL DATA:  Evaluate gallbladder. Acute cephalopathy. Multiple gallstones within the gallbladder on CT. EXAM: NUCLEAR MEDICINE HEPATOBILIARY IMAGING TECHNIQUE: Sequential images of the abdomen were obtained out to 60 minutes following intravenous administration of radiopharmaceutical. RADIOPHARMACEUTICALS:  5.4 mCi Tc-4m  Choletec IV COMPARISON:  None. FINDINGS: Prompt clearance radiotracer from the blood pool and homogeneous uptake in liver. Counts are evident within the duodenum by 20 minutes. The gallbladder fails to fill over 2.5 hours. Morphine was not administered due to patient's age. IMPRESSION: Non filling of the gallbladder with differential including acute cholecystitis versus chronic cholecystitis. The gallbladder is collapsed and full of stones on comparison CT without inflammation therefore favor chronic  cholecystitis. These results will be called to the ordering clinician or representative by the Radiologist Assistant, and communication documented in the PACS or [employer]. Electronically Signed   By: Suzy Bouchard M.D.   On: 07/04/2019 15:31   US Abdomen Limited  Result Date: 07/03/2019 CLINICAL DATA:  Gram-negative bacteremia EXAM: ULTRASOUND ABDOMEN LIMITED RIGHT UPPER QUADRANT COMPARISON:  CT abdomen/pelvis dated 06/26/2019 FINDINGS: Gallbladder: Surgically absent. Common bile duct: Diameter: Not discretely visualized. Liver: Poorly visualized. At the upper limits of normal for parenchymal echogenicity. No focal hepatic lesion is seen. Portal vein is patent on color Doppler imaging with normal direction of blood flow towards the liver. Other: None. IMPRESSION: Limited evaluation. Liver is poorly visualized but grossly unremarkable. Status post cholecystectomy. Electronically Signed   By: Julian Hy M.D.   On: 07/03/2019 20:21      Assessment/Plan CAD, H/O MI - ECHO with EF 55-60% HTN AKI, resolved Acute metabolic encephalopathy, resolved BPH Melanoma  Enterobacter bacteremia secondary to cholecystitis The patient has been found to have a gram - bacteremia with nonvisualization of the gallbladder.  Despite, tolerating a diet and without abdominal pain, he would likely benefit from cholecystectomy.  In this population of patients, it is not uncommon to be minimally symptomatic as far as pain, etc and still have cholecystitis.  He has had an echo that was unrevealing and should be stable for surgical intervention.  We can likely plan to do this tomorrow. He is going to discuss with his daughters tonight and make a decision on whether he would like to undergo surgery - we will follow up in the morning.    FEN - heart healthy, NPO p MN VTE -  lovenox ID - [email or web address]   [name] , [facility] 07/04/2019, 4:17 PM Please see Amion for pager number during  day hours 7:00am-4:30pm

## 2019-07-04 NOTE — Progress Notes (Signed)
Occupational Therapy Treatment Note  Pt ambulated back to room with min A using RW after working with PT. Pt very fatigued once back to room and unable to stand at sink to complete ADL task due to generalized weakness. HR 134; SpO2 90s on RA with 2/4 DOE. Pt will need 24/7 S at DC. Will continue to follow acutely.     07/04/19 1851  OT Visit Information  Last OT Received On 07/04/19  Assistance Needed +1  History of Present Illness Pt is an 86 yo male s/p sepsis, tachycardia, AKI, fever. Pt PMHx: HTN, CAD, MI, BPH, melanoma.  Precautions  Precautions Fall  Pain Assessment  Pain Assessment No/denies pain  Cognition  Arousal/Alertness Awake/alert  Behavior During Therapy WFL for tasks assessed/performed  Overall Cognitive Status Impaired/Different from baseline (for simple mobiltiy tasks)  Area of Impairment Attention;Awareness;Safety/judgement;Problem solving  Current Attention Level Selective  Memory Decreased short-term memory  Following Commands Follows one step commands consistently  Safety/Judgement Decreased awareness of safety  Awareness Emergent  Problem Solving Slow processing  General Comments Once in room, pt pushing RW out to side adn unsafely walking to chair due to fatigue. Performance also may have been related to fatigue  Upper Extremity Assessment  Upper Extremity Assessment Generalized weakness  Lower Extremity Assessment  Lower Extremity Assessment Defer to PT evaluation  ADL  Overall ADL's  Needs assistance/impaired  Grooming Set up;Sitting  Functional mobility during ADLs Minimal assistance;Rolling walker;Cueing for safety  General ADL Comments ADL limited due to fatigue after ambulating in hall  Balance  Overall balance assessment Needs assistance  Standing balance-Leahy Scale Poor  Standing balance comment reliant on external support  Transfers  Overall transfer level Needs assistance  Equipment used Rolling walker (2 wheeled)  Transfers Sit to/from  Stand  Sit to Stand Min assist  OT - End of Session  Equipment Utilized During Treatment Gait belt;Rolling walker  Activity Tolerance Patient limited by fatigue  Patient left in chair;with call bell/phone within reach;with chair alarm set  Nurse Communication Mobility status  OT Assessment/Plan  OT Plan Discharge plan remains appropriate  OT Visit Diagnosis Unsteadiness on feet (R26.81);Muscle weakness (generalized) (M62.81);Other symptoms and signs involving cognitive function  OT Frequency (ACUTE ONLY) Min 2X/week  Follow Up Recommendations Home health OT;Supervision/Assistance - 24 hour  OT Equipment None recommended by OT  AM-PAC OT "6 Clicks" Daily Activity Outcome Measure (Version 2)  Help from another person eating meals? 4  Help from another person taking care of personal grooming? 3  Help from another person toileting, which includes using toliet, bedpan, or urinal? 3  Help from another person bathing (including washing, rinsing, drying)? 3  Help from another person to put on and taking off regular upper body clothing? 3  Help from another person to put on and taking off regular lower body clothing? 3  6 Click Score 19  OT Goal Progression  Progress towards OT goals Progressing toward goals  Acute Rehab OT Goals  Patient Stated Goal to get stronger  OT Goal Formulation With patient/family  Time For Goal Achievement 07/16/19  Potential to Achieve Goals Good  ADL Goals  Pt Will Perform Lower Body Dressing with supervision;sitting/lateral leans;sit to/from stand  Pt Will Transfer to Toilet with supervision;ambulating;bedside commode  Pt Will Perform Toileting - Clothing Manipulation and hygiene with supervision;sitting/lateral leans;sit to/from stand  Additional ADL Goal #1 Pt will stand at sink x10 mins for ADL tasks with supervisionA in order to increase activity tolerance.  Additional ADL  Goal #2 Pt will follow multi step commands consistently with minimal cues to attend to  task.  OT Time Calculation  OT Start Time (ACUTE ONLY) 1631  OT Stop Time (ACUTE ONLY) 1654  OT Time Calculation (min) 23 min  OT General Charges  $OT Visit 1 Visit  OT Treatments  $Self Care/Home Management  23-37 mins  Maurie Boettcher, OT/L   Acute OT Clinical Specialist [place] Pager [phone] Office [phone]

## 2019-07-04 NOTE — TOC Progression Note (Signed)
Transition of Care [facility]) - Progression Note    Patient Details  Name: Jeffrey Underwood MRN: [id number] Date of Birth: March 31, 1928  Transition of Care [facility]) CM/SW Contact  Bartholomew Crews, RN Phone Number: [phone] 07/04/2019, 1:14 PM  Clinical Narrative:     [place] PT and OT referral accepted by [facility]. Patient will need HH orders for PT and OT with face to face at discharge. TOC team following for transition needs.   Expected Discharge Plan: [place] Barriers to Discharge: Continued Medical Work up  Expected Discharge Plan and Services Expected Discharge Plan: [place]   Discharge Planning Services: CM Consult Post Acute Care Choice: [place] arrangements for the past 2 months: Single Family Home                                       Social Determinants of Health (SDOH) Interventions    Readmission Risk Interventions No flowsheet data found.

## 2019-07-05 ENCOUNTER — Inpatient Hospital Stay (HOSPITAL_COMMUNITY): Payer: Medicare Other | Admitting: Certified Registered Nurse Anesthetist

## 2019-07-05 ENCOUNTER — Encounter (HOSPITAL_COMMUNITY): Payer: Self-pay | Admitting: Internal Medicine

## 2019-07-05 ENCOUNTER — Encounter (HOSPITAL_COMMUNITY)
Admission: EM | Disposition: A | Discharge status: Home Health | Payer: Self-pay | Source: Home / Self Care | Attending: Internal Medicine

## 2019-07-05 HISTORY — PX: CHOLECYSTECTOMY: SHX55

## 2019-07-05 LAB — COMPREHENSIVE METABOLIC PANEL
ALT: 31 U/L (ref 0–44)
AST: 43 U/L — ABNORMAL HIGH (ref 15–41)
Albumin: 2.5 g/dL — ABNORMAL LOW (ref 3.5–5.0)
Alkaline Phosphatase: 74 U/L (ref 38–126)
Anion gap: 7 (ref 5–15)
BUN: 16 mg/dL (ref 8–23)
CO2: 30 mmol/L (ref 22–32)
Calcium: 8.5 mg/dL — ABNORMAL LOW (ref 8.9–10.3)
Chloride: 104 mmol/L (ref 98–111)
Creatinine, Ser: 0.96 mg/dL (ref 0.61–1.24)
GFR calc Af Amer: >60 mL/min (ref >60)
GFR calc non Af Amer: >60 mL/min (ref >60)
Glucose, Bld: 108 mg/dL — ABNORMAL HIGH (ref 70–99)
Potassium: 4.5 mmol/L (ref 3.5–5.1)
Sodium: 141 mmol/L (ref 135–145)
Total Bilirubin: 0.7 mg/dL (ref 0.3–1.2)
Total Protein: 5.3 g/dL — ABNORMAL LOW (ref 6.5–8.1)

## 2019-07-05 LAB — CBC
HCT: 36 % — ABNORMAL LOW (ref 39.0–52.0)
Hemoglobin: 11.7 g/dL — ABNORMAL LOW (ref 13.0–17.0)
MCH: 28.7 pg (ref 26.0–34.0)
MCHC: 32.5 g/dL (ref 30.0–36.0)
MCV: 88.5 fL (ref 80.0–100.0)
Platelets: UNDETERMINED 10*3/uL (ref 150–400)
RBC: 4.07 MIL/uL — ABNORMAL LOW (ref 4.22–5.81)
RDW: 13.5 % (ref 11.5–15.5)
WBC: 7.8 10*3/uL (ref 4.0–10.5)
nRBC: 0 % (ref 0.0–0.2)

## 2019-07-05 LAB — GLUCOSE, CAPILLARY: Glucose-Capillary: 100 mg/dL — ABNORMAL HIGH (ref 70–99)

## 2019-07-05 SURGERY — LAPAROSCOPIC CHOLECYSTECTOMY
Anesthesia: General | Site: Abdomen

## 2019-07-05 MED ORDER — ONDANSETRON HCL 4 MG/2ML IJ SOLN
INTRAMUSCULAR | Status: DC | PRN
  Administered 2019-07-05: 4 mg via INTRAVENOUS

## 2019-07-05 MED ORDER — FENTANYL CITRATE (PF) 250 MCG/5ML IJ SOLN
INTRAMUSCULAR | Status: DC | PRN
  Administered 2019-07-05: 25 ug via INTRAVENOUS
  Administered 2019-07-05: 50 ug via INTRAVENOUS
  Administered 2019-07-05 (×2): 25 ug via INTRAVENOUS

## 2019-07-05 MED ORDER — HYDROMORPHONE HCL 1 MG/ML IJ SOLN: 0.2500 mg | INTRAMUSCULAR | Status: DC | PRN

## 2019-07-05 MED ORDER — 0.9 % SODIUM CHLORIDE (POUR BTL) OPTIME
TOPICAL | Status: DC | PRN
  Administered 2019-07-05: 1000 mL

## 2019-07-05 MED ORDER — PHENYLEPHRINE HCL-NACL 10-0.9 MG/250ML-% IV SOLN
INTRAVENOUS | Status: DC | PRN
  Administered 2019-07-05: 30 ug/min via INTRAVENOUS

## 2019-07-05 MED ORDER — ROCURONIUM BROMIDE 10 MG/ML (PF) SYRINGE
PREFILLED_SYRINGE | INTRAVENOUS | Status: AC
  Filled 2019-07-05: qty 10

## 2019-07-05 MED ORDER — BUPIVACAINE HCL 0.25 % IJ SOLN
INTRAMUSCULAR | Status: DC | PRN
  Administered 2019-07-05: 8 mL

## 2019-07-05 MED ORDER — PROMETHAZINE HCL 25 MG/ML IJ SOLN: 6.2500 mg | INTRAMUSCULAR | Status: DC | PRN

## 2019-07-05 MED ORDER — LACTATED RINGERS IV SOLN: INTRAVENOUS | Status: DC

## 2019-07-05 MED ORDER — ROCURONIUM BROMIDE 50 MG/5ML IV SOSY
PREFILLED_SYRINGE | INTRAVENOUS | Status: DC | PRN
  Administered 2019-07-05: 40 mg via INTRAVENOUS

## 2019-07-05 MED ORDER — BUPIVACAINE HCL (PF) 0.25 % IJ SOLN
INTRAMUSCULAR | Status: AC
  Filled 2019-07-05: qty 30

## 2019-07-05 MED ORDER — DEXAMETHASONE SODIUM PHOSPHATE 10 MG/ML IJ SOLN
INTRAMUSCULAR | Status: DC | PRN
  Administered 2019-07-05: 10 mg via INTRAVENOUS

## 2019-07-05 MED ORDER — SODIUM CHLORIDE 0.9 % IR SOLN
Status: DC | PRN
  Administered 2019-07-05: 1000 mL

## 2019-07-05 MED ORDER — LIDOCAINE 2% (20 MG/ML) 5 ML SYRINGE
INTRAMUSCULAR | Status: AC
  Filled 2019-07-05: qty 5

## 2019-07-05 MED ORDER — PHENYLEPHRINE 40 MCG/ML (10ML) SYRINGE FOR IV PUSH (FOR BLOOD PRESSURE SUPPORT)
PREFILLED_SYRINGE | INTRAVENOUS | Status: DC | PRN
  Administered 2019-07-05: 80 ug via INTRAVENOUS
  Administered 2019-07-05: 160 ug via INTRAVENOUS
  Administered 2019-07-05: 120 ug via INTRAVENOUS
  Administered 2019-07-05: 80 ug via INTRAVENOUS

## 2019-07-05 MED ORDER — OXYCODONE HCL 5 MG PO TABS
5.0000 mg | ORAL_TABLET | ORAL | Status: DC | PRN
  Administered 2019-07-06: 5 mg via ORAL
  Filled 2019-07-05: qty 1

## 2019-07-05 MED ORDER — MORPHINE SULFATE (PF) 2 MG/ML IV SOLN: 1.0000 mg | INTRAVENOUS | Status: DC | PRN

## 2019-07-05 MED ORDER — HYDRALAZINE HCL 20 MG/ML IJ SOLN
5.0000 mg | Freq: Once | INTRAMUSCULAR | Status: AC
  Administered 2019-07-05: 5 mg via INTRAVENOUS

## 2019-07-05 MED ORDER — LIDOCAINE 2% (20 MG/ML) 5 ML SYRINGE
INTRAMUSCULAR | Status: DC | PRN
  Administered 2019-07-05: 20 mg via INTRAVENOUS

## 2019-07-05 MED ORDER — SUCCINYLCHOLINE CHLORIDE 200 MG/10ML IV SOSY
PREFILLED_SYRINGE | INTRAVENOUS | Status: DC | PRN
  Administered 2019-07-05: 140 mg via INTRAVENOUS

## 2019-07-05 MED ORDER — SUCCINYLCHOLINE CHLORIDE 200 MG/10ML IV SOSY
PREFILLED_SYRINGE | INTRAVENOUS | Status: AC
  Filled 2019-07-05: qty 10

## 2019-07-05 MED ORDER — OXYCODONE HCL 5 MG PO TABS: 5.0000 mg | ORAL_TABLET | Freq: Once | ORAL | Status: DC | PRN

## 2019-07-05 MED ORDER — FENTANYL CITRATE (PF) 250 MCG/5ML IJ SOLN
INTRAMUSCULAR | Status: AC
  Filled 2019-07-05: qty 5

## 2019-07-05 MED ORDER — ONDANSETRON HCL 4 MG/2ML IJ SOLN
INTRAMUSCULAR | Status: AC
  Filled 2019-07-05: qty 2

## 2019-07-05 MED ORDER — PROPOFOL 10 MG/ML IV BOLUS
INTRAVENOUS | Status: DC | PRN
  Administered 2019-07-05: 90 mg via INTRAVENOUS

## 2019-07-05 MED ORDER — SUGAMMADEX SODIUM 200 MG/2ML IV SOLN
INTRAVENOUS | Status: DC | PRN
  Administered 2019-07-05: 200 mg via INTRAVENOUS

## 2019-07-05 MED ORDER — HYDRALAZINE HCL 20 MG/ML IJ SOLN
INTRAMUSCULAR | Status: AC
  Filled 2019-07-05: qty 1

## 2019-07-05 MED ORDER — PHENYLEPHRINE 40 MCG/ML (10ML) SYRINGE FOR IV PUSH (FOR BLOOD PRESSURE SUPPORT)
PREFILLED_SYRINGE | INTRAVENOUS | Status: AC
  Filled 2019-07-05: qty 10

## 2019-07-05 MED ORDER — OXYCODONE HCL 5 MG/5ML PO SOLN: 5.0000 mg | Freq: Once | ORAL | Status: DC | PRN

## 2019-07-05 MED ORDER — DEXAMETHASONE SODIUM PHOSPHATE 10 MG/ML IJ SOLN
INTRAMUSCULAR | Status: AC
  Filled 2019-07-05: qty 1

## 2019-07-05 SURGICAL SUPPLY — 44 items
ADH SKN CLS APL DERMABOND .7 (GAUZE/BANDAGES/DRESSINGS) ×2
APL PRP STRL LF DISP 70% ISPRP (MISCELLANEOUS) ×2
BAG SPEC RTRVL 10 TROC 200 (ENDOMECHANICALS) ×2
BIOPATCH RED 1 DISK 7.0 (GAUZE/BANDAGES/DRESSINGS) ×1 IMPLANT
CANISTER SUCT 3000ML PPV (MISCELLANEOUS) ×3 IMPLANT
CHLORAPREP W/TINT 26 (MISCELLANEOUS) ×3 IMPLANT
CLIP VESOLOCK MED LG 6/CT (CLIP) ×4 IMPLANT
COVER SURGICAL LIGHT HANDLE (MISCELLANEOUS) ×3 IMPLANT
COVER WAND RF STERILE (DRAPES) ×3 IMPLANT
DERMABOND ADVANCED (GAUZE/BANDAGES/DRESSINGS) ×1
DERMABOND ADVANCED .7 DNX12 (GAUZE/BANDAGES/DRESSINGS) ×2 IMPLANT
DRSG TEGADERM 4X4.75 (GAUZE/BANDAGES/DRESSINGS) ×1 IMPLANT
ELECT REM PT RETURN 9FT ADLT (ELECTROSURGICAL) ×3
ELECTRODE REM PT RTRN 9FT ADLT (ELECTROSURGICAL) ×2 IMPLANT
GLOVE BIO SURGEON STRL SZ7.5 (GLOVE) ×3 IMPLANT
GOWN STRL REUS W/ TWL LRG LVL3 (GOWN DISPOSABLE) ×4 IMPLANT
GOWN STRL REUS W/ TWL XL LVL3 (GOWN DISPOSABLE) ×2 IMPLANT
GOWN STRL REUS W/TWL LRG LVL3 (GOWN DISPOSABLE) ×15
GOWN STRL REUS W/TWL XL LVL3 (GOWN DISPOSABLE) ×3
GRASPER SUT TROCAR 14GX15 (MISCELLANEOUS) ×3 IMPLANT
KIT BASIN OR (CUSTOM PROCEDURE TRAY) ×3 IMPLANT
KIT TURNOVER KIT B (KITS) ×3 IMPLANT
NDL INSUFFLATION 14GA 120MM (NEEDLE) ×2 IMPLANT
NEEDLE INSUFFLATION 14GA 120MM (NEEDLE) ×3 IMPLANT
NS IRRIG 1000ML POUR BTL (IV SOLUTION) ×3 IMPLANT
PAD ARMBOARD 7.5X6 YLW CONV (MISCELLANEOUS) ×6 IMPLANT
POUCH LAPAROSCOPIC INSTRUMENT (MISCELLANEOUS) ×3 IMPLANT
POUCH RETRIEVAL ECOSAC 10 (ENDOMECHANICALS) IMPLANT
POUCH RETRIEVAL ECOSAC 10MM (ENDOMECHANICALS) ×3
SCISSORS LAP 5X35 DISP (ENDOMECHANICALS) ×3 IMPLANT
SET CHOLANGIOGRAPHY FRANKLIN (SET/KITS/TRAYS/PACK) ×3 IMPLANT
SET IRRIG TUBING LAPAROSCOPIC (IRRIGATION / IRRIGATOR) ×3 IMPLANT
SET TUBE SMOKE EVAC HIGH FLOW (TUBING) ×3 IMPLANT
SLEEVE ENDOPATH XCEL 5M (ENDOMECHANICALS) ×4 IMPLANT
SPECIMEN JAR SMALL (MISCELLANEOUS) ×3 IMPLANT
STOPCOCK 4 WAY LG BORE MALE ST (IV SETS) ×3 IMPLANT
SUT ETHILON 2 0 FS 18 (SUTURE) ×1 IMPLANT
SUT MNCRL AB 4-0 PS2 18 (SUTURE) ×3 IMPLANT
TOWEL GREEN STERILE (TOWEL DISPOSABLE) ×3 IMPLANT
TOWEL GREEN STERILE FF (TOWEL DISPOSABLE) ×3 IMPLANT
TRAY LAPAROSCOPIC MC (CUSTOM PROCEDURE TRAY) ×3 IMPLANT
TROCAR XCEL NON-BLD 11X100MML (ENDOMECHANICALS) ×3 IMPLANT
TROCAR XCEL NON-BLD 5MMX100MML (ENDOMECHANICALS) ×3 IMPLANT
WATER STERILE IRR 1000ML POUR (IV SOLUTION) ×3 IMPLANT

## 2019-07-05 NOTE — Discharge Instructions (Signed)
[facility] LAPAROSCOPIC SURGERY: POST OP INSTRUCTIONS Always review your discharge instruction sheet given to you by the facility where your surgery was performed. IF YOU HAVE DISABILITY OR FAMILY LEAVE FORMS, YOU MUST BRING THEM TO THE OFFICE FOR PROCESSING.   DO NOT GIVE THEM TO YOUR DOCTOR.  PAIN CONTROL  1. First take acetaminophen (Tylenol) AND/or ibuprofen (Advil) to control your pain after surgery.  Follow directions on package.  Taking acetaminophen (Tylenol) and/or ibuprofen (Advil) regularly after surgery will help to control your pain and lower the amount of prescription pain medication you may need.  You should not take more than 3,000 mg (3 grams) of acetaminophen (Tylenol) in 24 hours.  You should not take ibuprofen (Advil), aleve, motrin, naprosyn or other NSAIDS if you have a history of stomach ulcers or chronic kidney disease.  2. A prescription for pain medication may be given to you upon discharge.  Take your pain medication as prescribed, if you still have uncontrolled pain after taking acetaminophen (Tylenol) or ibuprofen (Advil). 3. Use ice packs to help control pain. 4. If you need a refill on your pain medication, please contact your pharmacy.  They will contact our office to request authorization. Prescriptions will not be filled after 5pm or on week-ends.  HOME MEDICATIONS 5. Take your usually prescribed medications unless otherwise directed.  DIET 6. You should follow a light diet the first few days after arrival home.  Be sure to include lots of fluids daily. Avoid fatty, fried foods.   CONSTIPATION 7. It is common to experience some constipation after surgery and if you are taking pain medication.  Increasing fluid intake and taking a stool softener (such as Colace) will usually help or prevent this problem from occurring.  A mild laxative (Milk of Magnesia or Miralax) should be taken according to package instructions if there are no bowel  movements after 48 hours.  WOUND/INCISION CARE 8. Most patients will experience some swelling and bruising in the area of the incisions.  Ice packs will help.  Swelling and bruising can take several days to resolve.  9. Unless discharge instructions indicate otherwise, follow guidelines below  a. STERI-STRIPS - you may remove your outer bandages 48 hours after surgery, and you may shower at that time.  You have steri-strips (small skin tapes) in place directly over the incision.  These strips should be left on the skin for 7-10 days.   b. DERMABOND/SKIN GLUE - you may shower in 24 hours.  The glue will flake off over the next 2-3 weeks. 10. Any sutures or staples will be removed at the office during your follow-up visit.  ACTIVITIES 11. You may resume regular (light) daily activities beginning the next day--such as daily self-care, walking, climbing stairs--gradually increasing activities as tolerated.  You may have sexual intercourse when it is comfortable.  Refrain from any heavy lifting or straining until approved by your doctor. a. You may drive when you are no longer taking prescription pain medication, you can comfortably wear a seatbelt, and you can safely maneuver your car and apply brakes.  FOLLOW-UP 12. You should see your doctor in the office for a follow-up appointment approximately 2-3 weeks after your surgery.  You should have been given your post-op/follow-up appointment when your surgery was scheduled.  If you did not receive a post-op/follow-up appointment, make sure that you call for this appointment within a day or two after you arrive home to insure a convenient appointment time.  WHEN   TO CALL YOUR DOCTOR: 1. Fever over 101.0 2. Inability to urinate 3. Continued bleeding from incision. 4. Increased pain, redness, or drainage from the incision. 5. Increasing abdominal pain  The clinic staff is available to answer your questions during regular business hours.  Please don't  hesitate to call and ask to speak to one of the nurses for clinical concerns.  If you have a medical emergency, go to the nearest emergency room or call 911.  A surgeon from [facility] is always on call at the hospital. [address], [address], [place], [place]  [address] ? [address] [address], [place],    [address] [phone] ? [phone] ? FAX [phone] Web site: [email or web address]  

## 2019-07-05 NOTE — Progress Notes (Signed)
Patient initially stated that he ate a full breakfast this morning. Patient intermittently confused. Confirmed with 68M RN that patient has been NPO since midnight.

## 2019-07-05 NOTE — Anesthesia Procedure Notes (Addendum)
Procedure Name: Intubation Date/Time: 07/05/2019 2:52 PM Performed by: Griffin Dakin, CRNA Pre-anesthesia Checklist: Patient identified, Emergency Drugs available, Suction available and Patient being monitored Patient Re-evaluated:Patient Re-evaluated prior to induction Oxygen Delivery Method: Circle system utilized Preoxygenation: Pre-oxygenation with 100% oxygen Induction Type: IV induction Ventilation: Mask ventilation without difficulty and Oral airway inserted - appropriate to patient size Laryngoscope Size: Glidescope and 4 Grade View: Grade I Tube type: Oral Tube size: 7.5 mm Number of attempts: 1 Airway Equipment and Method: Oral airway,  Video-laryngoscopy and Rigid stylet Placement Confirmation: ETT inserted through vocal cords under direct vision,  positive ETCO2 and breath sounds checked- equal and bilateral Secured at: 21 cm Tube secured with: Tape Dental Injury: Teeth and Oropharynx as per pre-operative assessment  Difficulty Due To: Difficult Airway- due to reduced neck mobility

## 2019-07-05 NOTE — Op Note (Addendum)
07/05/2019  4:13 PM  PATIENT:  Jeffrey Underwood  86 y.o. male  PRE-OPERATIVE DIAGNOSIS:  Acute Cholecystitis  POST-OPERATIVE DIAGNOSIS:  Acute Purulent Cholecystitis, cholelithiasis  PROCEDURE:  Procedure(s): LAPAROSCOPIC CHOLECYSTECTOMY   SURGEON:  Surgeon(s) and Role:    Ralene Ok, MD - Primary  PHYSICIAN ASSISTANT: Saverio Danker, PA-C-Who was essential to help with retraction, identification of the anatomy, and closure.  ANESTHESIA:   local and general  EBL:  100 mL   BLOOD ADMINISTERED:none  DRAINS: (19Fr) Jackson-Pratt drain(s) with closed bulb suction in the subhepatic fossa   LOCAL MEDICATIONS USED:  BUPIVICAINE   SPECIMEN:  Source of Specimen:  gallbladder  DISPOSITION OF SPECIMEN:  PATHOLOGY  COUNTS:  YES  TOURNIQUET:  * No tourniquets in log *  DICTATION: .Dragon Dictation The patient was taken to the operating and placed in the supine position with bilateral SCDs in place.  The patient was prepped and draped in the usual sterile fashion. A time out was called and all facts were verified. A pneumoperitoneum was obtained via A Veress needle technique to a pressure of 81mm of mercury.  A 75mm trochar was then placed in the right upper quadrant under visualization, and there were no injuries to any abdominal organs. A 11 mm port was then placed in the umbilical region after infiltrating with local anesthesia under direct visualization. A second and third epigastric port and right lower quadrant port placement under direct visualization, respectively.    The gallbladder was very adherent to the duodenum.  This was taken down bluntly.  It appeared that the duodenum was intact with no spillage.  The gallbladder was retracted, the peritoneum was then sharply dissected from the gallbladder and this dissection was carried down to Calot's triangle. The gallbladder was identified and stripped away circumferentially and seen going into the gallbladder 360, the critical  angle was obtained.  2 clips were placed proximally one distally and the cystic duct transected. The cystic artery was identified and 2 clips placed proximally and one distally and transected.  We then proceeded to remove the gallbladder off the hepatic fossa with Bovie cautery. A retrieval bag was then placed in the abdomen and gallbladder placed in the bag. The hepatic fossa was then reexamined and hemostasis was achieved with Bovie cautery and was excellent at the end of the case. Due to the chronicity of the duodenum to the gallbladder I decided to leave a 19Fr black drain in the subhepatic fossa.  This was brought out of the right upper quadrant trocar site and secured to the abdominal wall with a 2-0 nylon.  The subhepatic fossa and perihepatic fossa was then irrigated until the effluent was clear.  The gallbladder and bag were removed from the abdominal cavity. The 11 mm trocar fascia was reapproximated with the Endo Close #1 Vicryl x4.  The pneumoperitoneum was evacuated and all trochars removed under direct visulalization.  The skin was then closed with 4-0 Monocryl and the skin dressed with Dermabond.    The patient was awaken from general anesthesia and taken to the recovery room in stable condition.   PLAN OF CARE: Admit to inpatient   PATIENT DISPOSITION:  PACU - hemodynamically stable.   Delay start of Pharmacological VTE agent (>24hrs) due to surgical blood loss or risk of bleeding: yes

## 2019-07-05 NOTE — Transfer of Care (Signed)
Immediate Anesthesia Transfer of Care Note  Patient: Jeffrey Underwood  Procedure(s) Performed: LAPAROSCOPIC CHOLECYSTECTOMY WITH POSSIBLE INTRAOPERATIVE CHOLANGIOGRAM (N/A Abdomen)  Patient Location: PACU  Anesthesia Type:General  Level of Consciousness: awake, alert  and oriented  Airway & Oxygen Therapy: Patient Spontanous Breathing and Patient connected to nasal cannula oxygen  Post-op Assessment: Report given to RN and Post -op Vital signs reviewed and stable  Post vital signs: Reviewed and stable  Last Vitals:  Vitals Value Taken Time  BP 226/92 07/05/19 1700  Temp 36.6 C 07/05/19 1653  Pulse 79 07/05/19 1704  Resp 19 07/05/19 1704  SpO2 90 % 07/05/19 1704  Vitals shown include unvalidated device data.  Last Pain:  Vitals:   07/05/19 1653  TempSrc:   PainSc: (P) Asleep      Patients Stated Pain Goal: 0 ([id number])  Complications: No apparent anesthesia complications

## 2019-07-05 NOTE — Anesthesia Postprocedure Evaluation (Signed)
Anesthesia Post Note  Patient: Jeffrey Underwood  Procedure(s) Performed: LAPAROSCOPIC CHOLECYSTECTOMY WITH POSSIBLE INTRAOPERATIVE CHOLANGIOGRAM (N/A Abdomen)     Patient location during evaluation: PACU Anesthesia Type: General Level of consciousness: awake and alert Pain management: pain level controlled Vital Signs Assessment: post-procedure vital signs reviewed and stable Respiratory status: spontaneous breathing, nonlabored ventilation, respiratory function stable and patient connected to nasal cannula oxygen Cardiovascular status: blood pressure returned to baseline and stable Postop Assessment: no apparent nausea or vomiting Anesthetic complications: no    Last Vitals:  Vitals:   07/05/19 1807 07/05/19 1808  BP: (!) 173/86 (!) 182/89  Pulse: 86 85  Resp: 18 20  Temp:    SpO2: 98% 97%    Last Pain:  Vitals:   07/05/19 1804  TempSrc:   PainSc: 0-No pain                 Evva Din COKER

## 2019-07-05 NOTE — Progress Notes (Signed)
[facility] Progress Note     Subjective: Patient denies abdominal pain, n/v this AM. He reports that he does wish to proceed with surgery and he talked with his daughter, Jeffrey Underwood, again this AM. Consent in chart and signed   Review of Systems  Respiratory: Negative for shortness of breath.   Cardiovascular: Negative for chest pain.  Gastrointestinal: Negative for abdominal pain, nausea and vomiting.     Objective: Vital signs in last 24 hours: Temp:  [97.3 F (36.3 C)-99.3 F (37.4 C)] 97.3 F (36.3 C) (04/13 0516) Pulse Rate:  [76-87] 76 (04/13 0516) Resp:  [18] 18 (04/13 0516) BP: (148-159)/(81-88) 159/81 (04/13 0516) SpO2:  [95 %-100 %] 95 % (04/13 0516) Last BM Date: 07/02/19  Intake/Output from previous day: 04/12 0701 - 04/13 0700 In: 3426.3 [P.O.:720; I.V.:2306.3; IV Piggyback:400] Out: 2660 [Urine:2660] Intake/Output this shift: No intake/output data recorded.  PE: General: pleasant, WD, WN white male who is laying in bed in NAD HEENT:  Sclera are anicteric.  PERRL.  Ears and nose without any masses or lesions.  Mouth is pink and moist Heart: regular, rate, and rhythm. Systolic murmur heard.  Palpable radial and pedal pulses bilaterally Lungs: CTAB, no wheezes, rhonchi, or rales noted.  Respiratory effort nonlabored Abd: soft, NT, ND, +BS, no masses, hernias, or organomegaly MS: all 4 extremities are symmetrical with no cyanosis, clubbing, or edema. Skin: warm and dry with no masses, lesions, or rashes Neuro: Cranial nerves 2-12 grossly intact, sensation is normal throughout Psych: A&Ox3 with an appropriate affect.   Lab Results:  Recent Labs    07/03/19 0607 07/05/19 0358  WBC 7.6 7.8  HGB 11.9* 11.7*  HCT 36.4* 36.0*  PLT PLATELET CLUMPS NOTED ON SMEAR, UNABLE TO ESTIMATE PLATELET CLUMPS NOTED ON SMEAR, UNABLE TO ESTIMATE   BMET Recent Labs    07/03/19 0607 07/05/19 0358  NA 141 141  K 3.5 4.5  CL 107 104  CO2 27 30  GLUCOSE 88  108*  BUN 14 16  CREATININE 0.96 0.96  CALCIUM 8.2* 8.5*   PT/INR No results for input(s): LABPROT, INR in the last 72 hours. CMP     Component Value Date/Time   NA 141 07/05/2019 0358   K 4.5 07/05/2019 0358   CL 104 07/05/2019 0358   CO2 30 07/05/2019 0358   GLUCOSE 108 (H) 07/05/2019 0358   BUN 16 07/05/2019 0358   CREATININE 0.96 07/05/2019 0358   CALCIUM 8.5 (L) 07/05/2019 0358   PROT 5.3 (L) 07/05/2019 0358   ALBUMIN 2.5 (L) 07/05/2019 0358   AST 43 (H) 07/05/2019 0358   ALT 31 07/05/2019 0358   ALKPHOS 74 07/05/2019 0358   BILITOT 0.7 07/05/2019 0358   GFRNONAA >60 07/05/2019 0358   GFRAA >60 07/05/2019 0358   Lipase  No results found for: LIPASE     Studies/Results: NM Hepato W/EF  Result Date: 07/04/2019 CLINICAL DATA:  Evaluate gallbladder. Acute cephalopathy. Multiple gallstones within the gallbladder on CT. EXAM: NUCLEAR MEDICINE HEPATOBILIARY IMAGING TECHNIQUE: Sequential images of the abdomen were obtained out to 60 minutes following intravenous administration of radiopharmaceutical. RADIOPHARMACEUTICALS:  5.4 mCi Tc-81m  Choletec IV COMPARISON:  None. FINDINGS: Prompt clearance radiotracer from the blood pool and homogeneous uptake in liver. Counts are evident within the duodenum by 20 minutes. The gallbladder fails to fill over 2.5 hours. Morphine was not administered due to patient's age. IMPRESSION: Non filling of the gallbladder with differential including acute cholecystitis versus chronic cholecystitis. The gallbladder is  collapsed and full of stones on comparison CT without inflammation therefore favor chronic cholecystitis. These results will be called to the ordering clinician or representative by the Radiologist Assistant, and communication documented in the PACS or [employer]. Electronically Signed   By: Suzy Bouchard M.D.   On: 07/04/2019 15:31   US Abdomen Limited  Result Date: 07/03/2019 CLINICAL DATA:  Gram-negative bacteremia EXAM:  ULTRASOUND ABDOMEN LIMITED RIGHT UPPER QUADRANT COMPARISON:  CT abdomen/pelvis dated 06/26/2019 FINDINGS: Gallbladder: Surgically absent. Common bile duct: Diameter: Not discretely visualized. Liver: Poorly visualized. At the upper limits of normal for parenchymal echogenicity. No focal hepatic lesion is seen. Portal vein is patent on color Doppler imaging with normal direction of blood flow towards the liver. Other: None. IMPRESSION: Limited evaluation. Liver is poorly visualized but grossly unremarkable. Status post cholecystectomy. Electronically Signed   By: Julian Hy M.D.   On: 07/03/2019 20:21    Anti-infectives: Anti-infectives (From admission, onward)   Start     Dose/Rate Route Frequency Ordered Stop   07/01/19 1100  vancomycin (VANCOREADY) IVPB 750 mg/150 mL  Status:  Discontinued     750 mg 150 mL/hr over 60 Minutes Intravenous Every 24 hours 06/30/19 1111 07/01/19 0515   06/30/19 2230  ceFEPIme (MAXIPIME) 2 g in sodium chloride 0.9 % 100 mL IVPB     2 g 200 mL/hr over 30 Minutes Intravenous Every 12 hours 06/30/19 1111     06/30/19 1030  ceFEPIme (MAXIPIME) 2 g in sodium chloride 0.9 % 100 mL IVPB     2 g 200 mL/hr over 30 Minutes Intravenous  Once 06/30/19 1018 06/30/19 1058   06/30/19 1030  vancomycin (VANCOCIN) IVPB 1000 mg/200 mL premix     1,000 mg 200 mL/hr over 60 Minutes Intravenous  Once 06/30/19 1018 06/30/19 1157       Assessment/Plan CAD, H/O MI - ECHO with EF 55-60% HTN AKI, resolved Acute metabolic encephalopathy, resolved BPH Melanoma  Enterobacter bacteremia secondary to cholecystitis - to OR for lap chole today    FEN - NPO, IVF VTE - lovenox ID - maxipime  LOS: 5 days    [name] , [facility] 07/05/2019, 9:19 AM Please see Amion for pager number during day hours 7:00am-4:30pm

## 2019-07-05 NOTE — Anesthesia Preprocedure Evaluation (Addendum)
Anesthesia Evaluation  Patient identified by MRN, date of birth, ID band Patient awake    Reviewed: Allergy & Precautions, NPO status , Patient's Chart, lab work & pertinent test results  Airway Mallampati: II  TM Distance: >3 FB Neck ROM: Full    Dental  (+) Edentulous Upper, Edentulous Lower   Pulmonary neg pulmonary ROS, former smoker,    Pulmonary exam normal breath sounds clear to auscultation       Cardiovascular hypertension, Pt. on medications + CAD and + Past MI  Normal cardiovascular exam Rhythm:Regular Rate:Normal     Neuro/Psych negative neurological ROS  negative psych ROS   GI/Hepatic Neg liver ROS, GERD  ,  Endo/Other  negative endocrine ROSdiabetes  Renal/GU Renal InsufficiencyRenal disease  negative genitourinary   Musculoskeletal  (+) Arthritis , Osteoarthritis,    Abdominal   Peds negative pediatric ROS (+)  Hematology negative hematology ROS (+)   Anesthesia Other Findings   Reproductive/Obstetrics negative OB ROS                            Anesthesia Physical Anesthesia Plan  ASA: III  Anesthesia Plan: General   Post-op Pain Management:    Induction: Intravenous  PONV Risk Score and Plan: 2 and Ondansetron, Midazolam and Treatment may vary due to age or medical condition  Airway Management Planned: Oral ETT  Additional Equipment:   Intra-op Plan:   Post-operative Plan: Extubation in OR  Informed Consent: I have reviewed the patients History and Physical, chart, labs and discussed the procedure including the risks, benefits and alternatives for the proposed anesthesia with the patient or authorized representative who has indicated his/her understanding and acceptance.     Dental advisory given  Plan Discussed with: CRNA  Anesthesia Plan Comments:         Anesthesia Quick Evaluation

## 2019-07-05 NOTE — Progress Notes (Signed)
Progress Note    Jeffrey Underwood  [id number] DOB: 03/30/1928  DOA: 06/30/2019 PCP: Josetta Huddle, MD    Brief Narrative:   Chief complaint: acute encephalopathy  Medical records reviewed and are as summarized below:  Jeffrey Underwood is a very pleasant 86 y.o. male with a past medical hx of HTN, CAD, MI, BPH melanoma presented 4/8 cc ams. Work up reveals sepsis with blood cultures +gm negative rods, fever, elevated lactic acid, tachycardia.  Provided with IV fluids and antibiotics.  Most likely source for gram-negative bacteremia was gallbladder.  General surgery consulted and plan is to remove gallbladder today.  Assessment/Plan:   Principal Problem:   Sepsis ([place]) Active Problems:   Essential hypertension   Acute metabolic encephalopathy   AKI (acute kidney injury) ([place])   Normocytic anemia   Hyperlipidemia   GERD (gastroesophageal reflux disease)   Hypokalemia   Cholelithiasis   Bacteremia  Sepsis with Enterobacter aerogenes in blood culture and concern for acute cholelithiasis.  - Urinalysis did not show clear signs of infection and chest x-ray was otherwise noted to be clear.   -COVID-19 and influenza screening were negative.   -Antibiotics of vancomycin and cefepime started and vanc stopped 4/9 once blood cultures back as gram-negative -continue cefepime -Culture showing Enterobacter -tylenol for fever -Source most likely gallbladder so general surgery has been consulted and plan is to remove gallbladder today  Acute metabolic encephalopathy:  -Improved -Suspect patient's mental status will wax and wane while he is in the hospital  Acute kidney injury:  -reSolved  Hypokalemia/hypomagnesemia:  -Repleted  Essential hypertension:  -Resume metoprolol  Cholelithiasis: Patient evaluated at [facility] told last week that he had gallstones, and had been recommended outpatient HIDA scan.  Currently no nausea and patient eating. LFT's within limits of  normal.  Normocytic anemia:  -Trend  Hyperlipidemia -Continue Crestor  Depression -Continue the Effexor and olanzapine  GERD -Continue Pepcid   Family Communication/Anticipated D/C date and plan/Code Status   DVT prophylaxis: Lovenox ordered. Code Status: Full Code.  Family Communication: spoke with daughter on 4/12 (pam) Disposition Plan:  Status is: Inpatient  Remains inpatient appropriate because:Inpatient level of care appropriate due to severity of illness   Dispo: The patient is from: Home              Anticipated d/c is to: Home              Anticipated d/c date is: 2 days              Patient currently is not medically stable to d/c.         Medical Consultants:    General surgery   Anti-Infectives:    Vancomycin 4/8-4/9  Cefepime 4/8>>  Subjective:  No current complaints  Objective:    Vitals:   07/04/19 0855 07/04/19 2123 07/05/19 0516 07/05/19 0900  BP: (!) 155/82 (!) 148/88 (!) 159/81 (!) 150/75  Pulse: 89 87 76 80  Resp: 18 18 18 18   Temp: 98 F (36.7 C) 99.3 F (37.4 C) (!) 97.3 F (36.3 C) 97.6 F (36.4 C)  TempSrc: Oral Oral Oral Oral  SpO2: 98% 100% 95% 98%  Weight:      Height:        Intake/Output Summary (Last 24 hours) at 07/05/2019 1121 Last data filed at 07/05/2019 0900 Gross per 24 hour  Intake 3576.31 ml  Output 3460 ml  Net 116.31 ml   Filed Weights   06/30/19 1013 07/03/19 2146  Weight: 68 kg 70.5 kg    Exam: In bed, no acute distress Regular rate and rhythm No increased work of breathing Appears stated age Moves all 4 extremities   Data Reviewed:   I have personally reviewed following labs and imaging studies:  Labs: Labs show the following:   Basic Metabolic Panel: Recent Labs  Lab 06/30/19 1015 06/30/19 1015 07/01/19 0519 07/01/19 0519 07/02/19 0519 07/02/19 0519 07/03/19 0607 07/05/19 0358  NA 132*  --  140  --  141  --  141 141  K 2.9*   < > 4.6   < > 3.5   < > 3.5 4.5   CL 98  --  105  --  107  --  107 104  CO2 21*  --  26  --  24  --  27 30  GLUCOSE 169*  --  86  --  88  --  88 108*  BUN 38*  --  28*  --  20  --  14 16  CREATININE 1.56*  --  1.13  --  0.97  --  0.96 0.96  CALCIUM 8.2*  --  8.4*  --  7.8*  --  8.2* 8.5*  MG 1.6*  --   --   --   --   --   --   --    < > = values in this interval not displayed.   GFR Estimated Creatinine Clearance: 53 mL/min (by C-G formula based on SCr of 0.96 mg/dL). Liver Function Tests: Recent Labs  Lab 06/30/19 1015 07/01/19 0519 07/02/19 0519 07/05/19 0358  AST 44* 31 25 43*  ALT 28 23 20 31   ALKPHOS 113 67 75 74  BILITOT 1.4* 1.0 0.8 0.7  PROT 6.0* 4.9* 5.2* 5.3*  ALBUMIN 3.1* 2.5* 2.5* 2.5*   No results for input(s): LIPASE, AMYLASE in the last 168 hours. No results for input(s): AMMONIA in the last 168 hours. Coagulation profile Recent Labs  Lab 06/30/19 1015  INR 1.3*    CBC: Recent Labs  Lab 06/30/19 1015 07/01/19 0519 07/02/19 0519 07/03/19 0607 07/05/19 0358  WBC 7.7 11.0* 7.9 7.6 7.8  NEUTROABS 7.3  --   --   --   --   HGB 12.2* 10.9* 11.6* 11.9* 11.7*  HCT 36.3* 33.5* 35.0* 36.4* 36.0*  MCV 88.5 88.9 88.8 89.4 88.5  PLT PLATELET CLUMPS NOTED ON SMEAR, UNABLE TO ESTIMATE PLATELET CLUMPS NOTED ON SMEAR, UNABLE TO ESTIMATE PLATELET CLUMPS NOTED ON SMEAR, UNABLE TO ESTIMATE PLATELET CLUMPS NOTED ON SMEAR, UNABLE TO ESTIMATE PLATELET CLUMPS NOTED ON SMEAR, UNABLE TO ESTIMATE   Cardiac Enzymes: No results for input(s): CKTOTAL, CKMB, CKMBINDEX, TROPONINI in the last 168 hours. BNP (last 3 results) No results for input(s): PROBNP in the last 8760 hours. CBG: Recent Labs  Lab 06/30/19 1008 07/02/19 0641 07/03/19 1153 07/03/19 1634  GLUCAP 156* 78 126* 120*   D-Dimer: No results for input(s): DDIMER in the last 72 hours. Hgb A1c: No results for input(s): HGBA1C in the last 72 hours. Lipid Profile: No results for input(s): CHOL, HDL, LDLCALC, TRIG, CHOLHDL, LDLDIRECT in the  last 72 hours. Thyroid function studies: No results for input(s): TSH, T4TOTAL, T3FREE, THYROIDAB in the last 72 hours.  Invalid input(s): FREET3 Anemia work up: No results for input(s): VITAMINB12, FOLATE, FERRITIN, TIBC, IRON, RETICCTPCT in the last 72 hours. Sepsis Labs: Recent Labs  Lab 06/30/19 1015 06/30/19 1202 06/30/19 1429 06/30/19 1743 07/01/19 0519 07/01/19 0757 07/02/19 0519 07/03/19  [id number] 07/05/19 0358  WBC   < >  --   --   --  11.0*  --  7.9 7.6 7.8  LATICACIDVEN  --  2.8* 2.0* 2.7*  --  1.5  --   --   --    < > = values in this interval not displayed.    Microbiology Recent Results (from the past 240 hour(s))  Blood Culture (routine x 2)     Status: Abnormal   Collection Time: 06/30/19 10:15 AM   Specimen: BLOOD LEFT FOREARM  Result Value Ref Range Status   Specimen Description BLOOD LEFT FOREARM  Final   Special Requests   Final    BOTTLES DRAWN AEROBIC AND ANAEROBIC Blood Culture adequate volume   Culture  Setup Time   Final    GRAM NEGATIVE RODS IN BOTH AEROBIC AND ANAEROBIC BOTTLES CRITICAL RESULT CALLED TO, READ BACK BY AND VERIFIED WITH: Salli Real [id number] 07/01/2019 Mena Goes Performed at [facility], [place] [address], [place], [place] [address]    Culture ENTEROBACTER AEROGENES (A)  Final   Report Status 07/03/2019 FINAL  Final   Organism ID, Bacteria ENTEROBACTER AEROGENES  Final      Susceptibility   Enterobacter aerogenes - MIC*    CEFAZOLIN >=64 RESISTANT Resistant     CEFEPIME <=0.12 SENSITIVE Sensitive     CEFTAZIDIME <=1 SENSITIVE Sensitive     CEFTRIAXONE <=0.25 SENSITIVE Sensitive     CIPROFLOXACIN <=0.25 SENSITIVE Sensitive     GENTAMICIN <=1 SENSITIVE Sensitive     IMIPENEM 1 SENSITIVE Sensitive     TRIMETH/SULFA <=20 SENSITIVE Sensitive     PIP/TAZO <=4 SENSITIVE Sensitive     * ENTEROBACTER AEROGENES  Urine culture     Status: Abnormal   Collection Time: 06/30/19 10:15 AM   Specimen: In/Out Cath Urine  Result Value Ref  Range Status   Specimen Description IN/OUT CATH URINE  Final   Special Requests   Final    NONE Performed at [facility], [place] [address], [place], [place] [address]    Culture (A)  Final    <10,000 COLONIES/mL MULTIPLE SPECIES PRESENT, SUGGEST RECOLLECTION   Report Status 07/01/2019 FINAL  Final  Blood Culture ID Panel (Reflexed)     Status: Abnormal   Collection Time: 06/30/19 10:15 AM  Result Value Ref Range Status   Enterococcus species NOT DETECTED NOT DETECTED Final   Listeria monocytogenes NOT DETECTED NOT DETECTED Final   Staphylococcus species NOT DETECTED NOT DETECTED Final   Staphylococcus aureus (BCID) NOT DETECTED NOT DETECTED Final   Streptococcus species NOT DETECTED NOT DETECTED Final   Streptococcus agalactiae NOT DETECTED NOT DETECTED Final   Streptococcus pneumoniae NOT DETECTED NOT DETECTED Final   Streptococcus pyogenes NOT DETECTED NOT DETECTED Final   Acinetobacter baumannii NOT DETECTED NOT DETECTED Final   Enterobacteriaceae species DETECTED (A) NOT DETECTED Final    Comment: Enterobacteriaceae represent a large family of gram negative bacteria, not a single organism. Refer to culture for further identification. CRITICAL RESULT CALLED TO, READ BACK BY AND VERIFIED WITH: G. ABBOTT,PHARMD [id number] 07/01/2019 T. TYSOR    Enterobacter cloacae complex NOT DETECTED NOT DETECTED Final   Escherichia coli NOT DETECTED NOT DETECTED Final   Klebsiella oxytoca NOT DETECTED NOT DETECTED Final   Klebsiella pneumoniae NOT DETECTED NOT DETECTED Final   Proteus species NOT DETECTED NOT DETECTED Final   Serratia marcescens NOT DETECTED NOT DETECTED Final   Carbapenem resistance NOT DETECTED NOT DETECTED Final  Haemophilus influenzae NOT DETECTED NOT DETECTED Final   Neisseria meningitidis NOT DETECTED NOT DETECTED Final   Pseudomonas aeruginosa NOT DETECTED NOT DETECTED Final   Candida albicans NOT DETECTED NOT DETECTED Final   Candida glabrata NOT DETECTED NOT DETECTED  Final   Candida krusei NOT DETECTED NOT DETECTED Final   Candida parapsilosis NOT DETECTED NOT DETECTED Final   Candida tropicalis NOT DETECTED NOT DETECTED Final    Comment: Performed at [facility], [place] [address] [place], [place] [address]  Blood Culture (routine x 2)     Status: Abnormal   Collection Time: 06/30/19 10:20 AM   Specimen: BLOOD RIGHT HAND  Result Value Ref Range Status   Specimen Description BLOOD RIGHT HAND  Final   Special Requests   Final    BOTTLES DRAWN AEROBIC AND ANAEROBIC Blood Culture adequate volume   Culture  Setup Time   Final    GRAM NEGATIVE RODS IN BOTH AEROBIC AND ANAEROBIC BOTTLES CRITICAL VALUE NOTED.  VALUE IS CONSISTENT WITH PREVIOUSLY REPORTED AND CALLED VALUE.    Culture (A)  Final    ENTEROBACTER AEROGENES SUSCEPTIBILITIES PERFORMED ON PREVIOUS CULTURE WITHIN THE LAST 5 DAYS. Performed at [facility], [place] [address] [place], [place] [address]    Report Status 07/03/2019 FINAL  Final  Respiratory Panel by RT PCR (Flu A&B, Covid) - Nasopharyngeal Swab     Status: None   Collection Time: 06/30/19 11:00 AM   Specimen: Nasopharyngeal Swab  Result Value Ref Range Status   SARS Coronavirus 2 by RT PCR NEGATIVE NEGATIVE Final    Comment: (NOTE) SARS-CoV-2 target nucleic acids are NOT DETECTED. The SARS-CoV-2 RNA is generally detectable in upper respiratoy specimens during the acute phase of infection. The lowest concentration of SARS-CoV-2 viral copies this assay can detect is 131 copies/mL. A negative result does not preclude SARS-Cov-2 infection and should not be used as the sole basis for treatment or other patient management decisions. A negative result may occur with  improper specimen collection/handling, submission of specimen other than nasopharyngeal swab, presence of viral mutation(s) within the areas targeted by this assay, and inadequate number of viral copies (<131 copies/mL). A negative result must be combined with  clinical observations, patient history, and epidemiological information. The expected result is Negative. Fact Sheet for Patients:  PinkCheek.be Fact Sheet for Healthcare Providers:  GravelBags.it This test is not yet ap proved or cleared by the [place] FDA and  has been authorized for detection and/or diagnosis of SARS-CoV-2 by FDA under an Emergency Use Authorization (EUA). This EUA will remain  in effect (meaning this test can be used) for the duration of the COVID-19 declaration under Section 564(b)(1) of the Act, 21 U.S.C. section 360bbb-3(b)(1), unless the authorization is terminated or revoked sooner.    Influenza A by PCR NEGATIVE NEGATIVE Final   Influenza B by PCR NEGATIVE NEGATIVE Final    Comment: (NOTE) The Xpert Xpress SARS-CoV-2/FLU/RSV assay is intended as an aid in  the diagnosis of influenza from Nasopharyngeal swab specimens and  should not be used as a sole basis for treatment. Nasal washings and  aspirates are unacceptable for Xpert Xpress SARS-CoV-2/FLU/RSV  testing. Fact Sheet for Patients: PinkCheek.be Fact Sheet for Healthcare Providers: GravelBags.it This test is not yet approved or cleared by the [place] FDA and  has been authorized for detection and/or diagnosis of SARS-CoV-2 by  FDA under an Emergency Use Authorization (EUA). This EUA will remain  in effect (meaning this  test can be used) for the duration of the  Covid-19 declaration under Section 564(b)(1) of the Act, 21  U.S.C. section 360bbb-3(b)(1), unless the authorization is  terminated or revoked. Performed at [facility], [place] [address], [place], [place] [address]   Surgical pcr screen     Status: Abnormal   Collection Time: 07/04/19  9:08 PM   Specimen: Nasal Mucosa; Nasal Swab  Result Value Ref Range Status   MRSA, PCR POSITIVE (A) NEGATIVE Final     Comment: RESULT CALLED TO, READ BACK BY AND VERIFIED WITH: E CASTRO RN 07/04/19  2251 JDW    Staphylococcus aureus POSITIVE (A) NEGATIVE Final    Comment: (NOTE) The Xpert SA Assay (FDA approved for NASAL specimens in patients 55 years of age and older), is one component of a comprehensive surveillance program. It is not intended to diagnose infection nor to guide or monitor treatment. Performed at [facility], [place] [address] [place], [place] [address]     Procedures and diagnostic studies:  NM Hepato W/EF  Result Date: 07/04/2019 CLINICAL DATA:  Evaluate gallbladder. Acute cephalopathy. Multiple gallstones within the gallbladder on CT. EXAM: NUCLEAR MEDICINE HEPATOBILIARY IMAGING TECHNIQUE: Sequential images of the abdomen were obtained out to 60 minutes following intravenous administration of radiopharmaceutical. RADIOPHARMACEUTICALS:  5.4 mCi Tc-49m  Choletec IV COMPARISON:  None. FINDINGS: Prompt clearance radiotracer from the blood pool and homogeneous uptake in liver. Counts are evident within the duodenum by 20 minutes. The gallbladder fails to fill over 2.5 hours. Morphine was not administered due to patient's age. IMPRESSION: Non filling of the gallbladder with differential including acute cholecystitis versus chronic cholecystitis. The gallbladder is collapsed and full of stones on comparison CT without inflammation therefore favor chronic cholecystitis. These results will be called to the ordering clinician or representative by the Radiologist Assistant, and communication documented in the PACS or [employer]. Electronically Signed   By: Suzy Bouchard M.D.   On: 07/04/2019 15:31   US Abdomen Limited  Result Date: 07/03/2019 CLINICAL DATA:  Gram-negative bacteremia EXAM: ULTRASOUND ABDOMEN LIMITED RIGHT UPPER QUADRANT COMPARISON:  CT abdomen/pelvis dated 06/26/2019 FINDINGS: Gallbladder: Surgically absent. Common bile duct: Diameter: Not discretely visualized. Liver:  Poorly visualized. At the upper limits of normal for parenchymal echogenicity. No focal hepatic lesion is seen. Portal vein is patent on color Doppler imaging with normal direction of blood flow towards the liver. Other: None. IMPRESSION: Limited evaluation. Liver is poorly visualized but grossly unremarkable. Status post cholecystectomy. Electronically Signed   By: Julian Hy M.D.   On: 07/03/2019 20:21    Medications:   . aspirin  81 mg Oral QHS  . Chlorhexidine Gluconate Cloth  6 each Topical Q0600  . diclofenac Sodium  2 g Topical QID  . enoxaparin (LOVENOX) injection  40 mg Subcutaneous Q24H  . famotidine  20 mg Oral Daily  . metoprolol succinate  50 mg Oral Daily  . midodrine  5 mg Oral BID WC  . mupirocin ointment  1 application Nasal BID  . OLANZapine  5 mg Oral QHS  . rosuvastatin  10 mg Oral Q M,W,F-2000  . sodium chloride flush  3 mL Intravenous Q12H  . venlafaxine XR  37.5 mg Oral Q breakfast   Continuous Infusions: . sodium chloride 50 mL/hr at 07/04/19 2101  . ceFEPime (MAXIPIME) IV 2 g (07/05/19 0255)     LOS: 5 days   Geradine Girt  DO  [facility]   How to contact  the [facility] Attending or Consulting provider [place] or covering provider during after hours [place], for this patient?  1. Check the care team in [facility] and look for a) attending/consulting [facility] provider listed and b) the [facility] team listed 2. Log into [email or web address] and use [facility] universal password to access. If you do not have the password, please contact the hospital operator. 3. Locate the [facility] provider you are looking for under [facility] and page to a number that you can be directly reached. 4. If you still have difficulty reaching the provider, please page the [facility] (Director on Call) for the Hospitalists listed on amion for assistance.  07/05/2019, 11:21 AM

## 2019-07-06 DIAGNOSIS — K819 Cholecystitis, unspecified: Secondary | ICD-10-CM

## 2019-07-06 LAB — CBC
HCT: 35.8 % — ABNORMAL LOW (ref 39.0–52.0)
Hemoglobin: 11.7 g/dL — ABNORMAL LOW (ref 13.0–17.0)
MCH: 29 pg (ref 26.0–34.0)
MCHC: 32.7 g/dL (ref 30.0–36.0)
MCV: 88.6 fL (ref 80.0–100.0)
Platelets: UNDETERMINED 10*3/uL (ref 150–400)
RBC: 4.04 MIL/uL — ABNORMAL LOW (ref 4.22–5.81)
RDW: 13.5 % (ref 11.5–15.5)
WBC: 11.9 10*3/uL — ABNORMAL HIGH (ref 4.0–10.5)
nRBC: 0 % (ref 0.0–0.2)

## 2019-07-06 LAB — COMPREHENSIVE METABOLIC PANEL
ALT: 52 U/L — ABNORMAL HIGH (ref 0–44)
AST: 89 U/L — ABNORMAL HIGH (ref 15–41)
Albumin: 2.6 g/dL — ABNORMAL LOW (ref 3.5–5.0)
Alkaline Phosphatase: 71 U/L (ref 38–126)
Anion gap: 15 (ref 5–15)
BUN: 17 mg/dL (ref 8–23)
CO2: 20 mmol/L — ABNORMAL LOW (ref 22–32)
Calcium: 8.2 mg/dL — ABNORMAL LOW (ref 8.9–10.3)
Chloride: 104 mmol/L (ref 98–111)
Creatinine, Ser: 0.95 mg/dL (ref 0.61–1.24)
GFR calc Af Amer: >60 mL/min (ref >60)
GFR calc non Af Amer: >60 mL/min (ref >60)
Glucose, Bld: 146 mg/dL — ABNORMAL HIGH (ref 70–99)
Potassium: 4.5 mmol/L (ref 3.5–5.1)
Sodium: 139 mmol/L (ref 135–145)
Total Bilirubin: 1.1 mg/dL (ref 0.3–1.2)
Total Protein: 5.9 g/dL — ABNORMAL LOW (ref 6.5–8.1)

## 2019-07-06 MED ORDER — DOCUSATE SODIUM 100 MG PO CAPS
100.0000 mg | ORAL_CAPSULE | Freq: Two times a day (BID) | ORAL | Status: DC
  Administered 2019-07-06 – 2019-07-07 (×3): 100 mg via ORAL
  Filled 2019-07-06 (×3): qty 1

## 2019-07-06 MED ORDER — ACETAMINOPHEN 325 MG PO TABS
650.0000 mg | ORAL_TABLET | Freq: Four times a day (QID) | ORAL | Status: DC
  Administered 2019-07-06 – 2019-07-07 (×4): 650 mg via ORAL
  Filled 2019-07-06 (×4): qty 2

## 2019-07-06 MED ORDER — HYDRALAZINE HCL 20 MG/ML IJ SOLN: 10.0000 mg | Freq: Three times a day (TID) | INTRAMUSCULAR | Status: DC | PRN

## 2019-07-06 NOTE — Progress Notes (Signed)
[facility] Progress Note  1 Day Post-Op  Subjective: Patient is a little confused this AM but able to be reoriented easily. Reports abdominal pain was minimal overnight but that he is starting to have some pain on the right side. Denies nausea. Unsure if he has passed flatus overnight.   Objective: Vital signs in last 24 hours: Temp:  [97.6 F (36.4 C)-99.6 F (37.6 C)] 98.4 F (36.9 C) (04/14 0800) Pulse Rate:  [79-106] 97 (04/14 0800) Resp:  [15-23] 16 (04/14 0800) BP: (150-226)/(75-121) 172/92 (04/14 0800) SpO2:  [32 %-100 %] 99 % (04/14 0800) Last BM Date: 07/05/19  Intake/Output from previous day: 04/13 0701 - 04/14 0700 In: 1032.7 [P.O.:60; I.V.:872.7; IV Piggyback:100] Out: 1430 [Urine:1300; Drains:30; Blood:100] Intake/Output this shift: No intake/output data recorded.  PE: General: pleasant, WD, WN white male who is laying in bed in NAD HEENT: Sclera are anicteric. PERRL. Ears and nose without any masses or lesions. Mouth is pink and moist Heart: regular, rate, and rhythm.Systolic murmur heard. Palpable radial and pedal pulses bilaterally Lungs: CTAB, no wheezes, rhonchi, or rales noted. Respiratory effort nonlabored Abd: soft, mildly ttp in RUQ, ND, BS hypoactive, incisions c/d/i, drain in RUQ with SS fluid MS: all 4 extremities are symmetrical with no cyanosis, clubbing, or edema. Skin: warm and dry with no masses, lesions, or rashes    Lab Results:  Recent Labs    07/05/19 0358 07/06/19 0423  WBC 7.8 11.9*  HGB 11.7* 11.7*  HCT 36.0* 35.8*  PLT PLATELET CLUMPS NOTED ON SMEAR, UNABLE TO ESTIMATE PLATELET CLUMPS NOTED ON SMEAR, UNABLE TO ESTIMATE   BMET Recent Labs    07/05/19 0358 07/06/19 0423  NA 141 139  K 4.5 4.5  CL 104 104  CO2 30 20*  GLUCOSE 108* 146*  BUN 16 17  CREATININE 0.96 0.95  CALCIUM 8.5* 8.2*   PT/INR No results for input(s): LABPROT, INR in the last 72 hours. CMP     Component Value Date/Time   NA 139  07/06/2019 0423   K 4.5 07/06/2019 0423   CL 104 07/06/2019 0423   CO2 20 (L) 07/06/2019 0423   GLUCOSE 146 (H) 07/06/2019 0423   BUN 17 07/06/2019 0423   CREATININE 0.95 07/06/2019 0423   CALCIUM 8.2 (L) 07/06/2019 0423   PROT 5.9 (L) 07/06/2019 0423   ALBUMIN 2.6 (L) 07/06/2019 0423   AST 89 (H) 07/06/2019 0423   ALT 52 (H) 07/06/2019 0423   ALKPHOS 71 07/06/2019 0423   BILITOT 1.1 07/06/2019 0423   GFRNONAA >60 07/06/2019 0423   GFRAA >60 07/06/2019 0423   Lipase  No results found for: LIPASE     Studies/Results: NM Hepato W/EF  Result Date: 07/04/2019 CLINICAL DATA:  Evaluate gallbladder. Acute cephalopathy. Multiple gallstones within the gallbladder on CT. EXAM: NUCLEAR MEDICINE HEPATOBILIARY IMAGING TECHNIQUE: Sequential images of the abdomen were obtained out to 60 minutes following intravenous administration of radiopharmaceutical. RADIOPHARMACEUTICALS:  5.4 mCi Tc-76m  Choletec IV COMPARISON:  None. FINDINGS: Prompt clearance radiotracer from the blood pool and homogeneous uptake in liver. Counts are evident within the duodenum by 20 minutes. The gallbladder fails to fill over 2.5 hours. Morphine was not administered due to patient's age. IMPRESSION: Non filling of the gallbladder with differential including acute cholecystitis versus chronic cholecystitis. The gallbladder is collapsed and full of stones on comparison CT without inflammation therefore favor chronic cholecystitis. These results will be called to the ordering clinician or representative by the Radiologist Assistant, and communication  documented in the PACS or [employer]. Electronically Signed   By: Suzy Bouchard M.D.   On: 07/04/2019 15:31    Anti-infectives: Anti-infectives (From admission, onward)   Start     Dose/Rate Route Frequency Ordered Stop   07/01/19 1100  vancomycin (VANCOREADY) IVPB 750 mg/150 mL  Status:  Discontinued     750 mg 150 mL/hr over 60 Minutes Intravenous Every 24 hours  06/30/19 1111 07/01/19 0515   06/30/19 2230  ceFEPIme (MAXIPIME) 2 g in sodium chloride 0.9 % 100 mL IVPB     2 g 200 mL/hr over 30 Minutes Intravenous Every 12 hours 06/30/19 1111     06/30/19 1030  ceFEPIme (MAXIPIME) 2 g in sodium chloride 0.9 % 100 mL IVPB     2 g 200 mL/hr over 30 Minutes Intravenous  Once 06/30/19 1018 06/30/19 1058   06/30/19 1030  vancomycin (VANCOCIN) IVPB 1000 mg/200 mL premix     1,000 mg 200 mL/hr over 60 Minutes Intravenous  Once 06/30/19 1018 06/30/19 1157       Assessment/Plan CAD, H/O MI- ECHO with EF 55-60% HTN AKI, resolved Acute metabolic encephalopathy, resolved BPH Melanoma  Enterobacter bacteremia secondary to cholecystitis S/p laparoscopic cholecystectomy 07/05/19 Dr. Rosendo Gros - POD#1 - patient with some pain in the RUQ, schedule tylenol today - ok to advance diet to HH/CM - mobilize today - drain with 30 cc out overnight, fluid is SS - likely remove prior to discharge - I think will be stable for discharge tomorrow from a surgical standpoint, follow up in chart already  FEN -HH/CM diet, IVF VTE -lovenox ID -maxipime  LOS: 6 days    [name] , [facility] 07/06/2019, 8:26 AM Please see Amion for pager number during day hours 7:00am-4:30pm

## 2019-07-06 NOTE — Progress Notes (Signed)
Physical Therapy Treatment Patient Details Name: Jeffrey Underwood MRN: [id number] DOB: 1928-06-30 Today's Date: 07/06/2019    History of Present Illness Pt is an 86 yo male s/p sepsis, tachycardia, AKI, fever. Pt PMHx: HTN, CAD, MI, BPH, melanoma. S/p laparoscopic cholecystectomy 07/05/19 with JP drain     PT Comments    Patient continues to progress with functional mobility and activity tolerance. He complained of R UQ pain from his JP drain and R shoulder pain where he demonstrated extremely limited AROM shoulder flexion and abduction and fairly limited PROM, he states he has had surgery done on his R shoulder in the past but this pain is somewhat new. This did not limit his mobility or endurance as he ambulated ~262ft with RW and min guard for safety and cues for RW proximity. He required 1 seated rest break due to fatigue midway, HR 103-106 during ambulation. He required minA-min guard for sit<>stand transfers and cues for proper sequencing. He required frequent cueing for safety throughout session, especially for RW management (turns, transfers, ambulation). Hopeful to progress functional mobility and activity tolerance with improved pain management. Pt would continue to benefit from skilled physical therapy services at this time while admitted and after d/c to address the below listed limitations in order to improve overall safety and independence with functional mobility.   Follow Up Recommendations  Home health PT;Supervision/Assistance - 24 hour     Equipment Recommendations  Rolling walker with 5" wheels    Recommendations for Other Services       Precautions / Restrictions Precautions Precautions: Fall Precaution Comments: R JP drain Restrictions Weight Bearing Restrictions: No    Mobility  Bed Mobility Overal bed mobility: Needs Assistance Bed Mobility: Supine to Sit;Sit to Supine     Supine to sit: Min guard Sit to supine: Min guard   General bed mobility comments:  min guard for safety and line management. No physical assist needed.  Transfers Overall transfer level: Needs assistance Equipment used: Rolling walker (2 wheeled) Transfers: Sit to/from Stand Sit to Stand: Min assist;Min guard         General transfer comment: minA for initial power up from lower bed height. min guard for safety for sit<>stand from armed waiting room chair.  Ambulation/Gait Ambulation/Gait assistance: Min guard Gait Distance (Feet): 200 Feet Assistive device: Rolling walker (2 wheeled) Gait Pattern/deviations: Step-through pattern;Trunk flexed Gait velocity: dec   General Gait Details: VC for RW proximity, posture (flexed potentially due to R quadrant drain) and sequencing with turns. Required 1 seated rest break midway due to fatigue. HR up to 106bpm during ambulation. Pt notably fatigued post-ambulation    Stairs             Wheelchair Mobility    Modified Rankin (Stroke Patients Only)       Balance Overall balance assessment: Needs assistance Sitting-balance support: No upper extremity supported;Feet supported Sitting balance-Leahy Scale: Fair     Standing balance support: Bilateral upper extremity supported;During functional activity Standing balance-Leahy Scale: Poor Standing balance comment: reliant on UE support                            Cognition Arousal/Alertness: Awake/alert Behavior During Therapy: WFL for tasks assessed/performed Overall Cognitive Status: Impaired/Different from baseline Area of Impairment: Awareness;Memory;Safety/judgement                     Memory: Decreased short-term memory Following Commands: Follows one step commands consistently  Safety/Judgement: Decreased awareness of safety Awareness: Emergent   General Comments: Pt incorrectly states hes been up out of bed but hasnt -per daughter. Pt forgot he introduced his daughter, Jeannene Patella. Once in room, pt pushed RW aside and ambulated to [facility] to  sit due to fatigue      Exercises      General Comments General comments (skin integrity, edema, etc.): R shoulder pain- unable to AROM abduct more than 15deg, able to PROM to >100deg without pain, states this is newer but has operations on his R shoulder in the past. HR 78bpm at rest, 103-106bpm during ambulation and 83 post-ambulation in bed.        Pertinent Vitals/Pain Pain Assessment: Faces Faces Pain Scale: Hurts little more Pain Location: R shoulder (suprascapular mm.) and R UQ/drain site Pain Descriptors / Indicators: Discomfort;Grimacing;Guarding;Operative site guarding Pain Intervention(s): Limited activity within patient's tolerance;Monitored during session    Home Living                      Prior Function            PT Goals (current goals can now be found in the care plan section) Acute Rehab PT Goals PT Goal Formulation: Patient unable to participate in goal setting Time For Goal Achievement: 07/17/19 Potential to Achieve Goals: Good Progress towards PT goals: Progressing toward goals    Frequency    Min 3X/week      PT Plan Current plan remains appropriate    Co-evaluation              AM-PAC PT "6 Clicks" Mobility   Outcome Measure  Help needed turning from your back to your side while in a flat bed without using bedrails?: None Help needed moving from lying on your back to sitting on the side of a flat bed without using bedrails?: None Help needed moving to and from a bed to a chair (including a wheelchair)?: A Little Help needed standing up from a chair using your arms (e.g., wheelchair or bedside chair)?: A Little Help needed to walk in hospital room?: A Little Help needed climbing 3-5 steps with a railing? : A Lot 6 Click Score: 19    End of Session Equipment Utilized During Treatment: Gait belt Activity Tolerance: Patient tolerated treatment well Patient left: in bed;with call bell/phone within reach;with bed alarm set;with  family/visitor present   PT Visit Diagnosis: Unsteadiness on feet (R26.81);Other abnormalities of gait and mobility (R26.89)     Time: [id number] PT Time Calculation (min) (ACUTE ONLY): 30 min  Charges:  $Gait Training: 23-37 mins                     Lailana Shira, SPT Acute Rehab  [id number]    Misa Fedorko 07/06/2019, 5:14 PM

## 2019-07-06 NOTE — Progress Notes (Signed)
Progress Note    Jeffrey Underwood  [id number] DOB: 1928/10/24  DOA: 06/30/2019 PCP: Josetta Huddle, MD    Brief Narrative:   Chief complaint: acute encephalopathy  Medical records reviewed and are as summarized below:  Jeffrey Underwood is a very pleasant 86 y.o. male with a past medical hx of HTN, CAD, MI, BPH melanoma presented 4/8 cc ams. Work up reveals sepsis with blood cultures +gm negative rods, fever, elevated lactic acid, tachycardia.  Provided with IV fluids and antibiotics.  Most likely source for gram-negative bacteremia was gallbladder.  General surgery consulted.  Assessment/Plan:   Principal Problem:   Sepsis ([place]) Active Problems:   Essential hypertension   Acute metabolic encephalopathy   AKI (acute kidney injury) ([place])   Normocytic anemia   Hyperlipidemia   GERD (gastroesophageal reflux disease)   Hypokalemia   Cholelithiasis   Bacteremia   Sepsis with Enterobacter aerogenes bacteremia likely 2/2 cholecystitis Currently afebrile, with leukocytosis Likely source, gallbladder/acute cholecystitis Blood culture growing Enterobacter Urinalysis negative for infection Chest x-ray unremarkable Continue cefepime, likely for 7-10 days Monitor closely  Acute cholecystitis status post lap chole on 07/05/2019 by Dr. Rosendo Gros Status post drain, currently draining serosanguineous Further management per general surgery Continue IV gentle hydration  Acute metabolic encephalopathy Improving Likely secondary to above  Acute kidney injury, now with some acidosis AKI improved, now with acidosis Daily CMP  Essential hypertension Continue metoprolol  Normocytic anemia Daily CBC  Hyperlipidemia Continue Crestor  Depression Continue the Effexor and olanzapine  GERD Continue Pepcid   Family Communication/Anticipated D/C date and plan/Code Status   DVT prophylaxis: Lovenox ordered. Code Status: Full Code.  Family Communication: Discussed  extensively with patient Disposition Plan:  Status is: Inpatient  Remains inpatient appropriate because:Inpatient level of care appropriate due to severity of illness   Dispo: The patient is from: Home              Anticipated d/c is to: Home              Anticipated d/c date is: Pending general surgery and pt improvement, possibly 07/07/19              Patient currently is not medically stable to d/c.         Medical Consultants:    General surgery   Anti-Infectives:    Vancomycin 4/8-4/9  Cefepime 4/8>>  Subjective:  Denies any new complaints, minimal tenderness around surgical sites.  Denies any chest pain, shortness of breath, nausea/vomiting, fever/chills  Objective:    Vitals:   07/05/19 2059 07/06/19 0428 07/06/19 0800 07/06/19 1040  BP: (!) 158/94 (!) 151/79 (!) 172/92 (!) 159/86  Pulse: (!) 106 96 97   Resp: 16 15 16    Temp: 99.6 F (37.6 C) 98.2 F (36.8 C) 98.6 F (37 C)   TempSrc: Oral Oral Oral   SpO2: 98% 100% 99%   Weight:      Height:        Intake/Output Summary (Last 24 hours) at 07/06/2019 1720 Last data filed at 07/06/2019 1400 Gross per 24 hour  Intake 1002.67 ml  Output 1260 ml  Net -257.33 ml   Filed Weights   06/30/19 1013 07/03/19 2146  Weight: 68 kg 70.5 kg    Exam:  General: NAD, elderly  Cardiovascular: S1, S2 present  Respiratory: CTAB  Abdomen: Soft, minimal tenderness around incisions, nondistended, bowel sounds present  Musculoskeletal: No bilateral pedal edema noted  Skin: Normal  Psychiatry: Normal mood  Data Reviewed:   I have personally reviewed following labs and imaging studies:  Labs: Labs show the following:   Basic Metabolic Panel: Recent Labs  Lab 06/30/19 1015 06/30/19 1015 07/01/19 0519 07/01/19 0519 07/02/19 0519 07/02/19 0519 07/03/19 0607 07/03/19 0607 07/05/19 0358 07/06/19 0423  NA 132*   < > 140  --  141  --  141  --  141 139  K 2.9*   < > 4.6   < > 3.5   < > 3.5   <  > 4.5 4.5  CL 98   < > 105  --  107  --  107  --  104 104  CO2 21*   < > 26  --  24  --  27  --  30 20*  GLUCOSE 169*   < > 86  --  88  --  88  --  108* 146*  BUN 38*   < > 28*  --  20  --  14  --  16 17  CREATININE 1.56*   < > 1.13  --  0.97  --  0.96  --  0.96 0.95  CALCIUM 8.2*   < > 8.4*  --  7.8*  --  8.2*  --  8.5* 8.2*  MG 1.6*  --   --   --   --   --   --   --   --   --    < > = values in this interval not displayed.   GFR Estimated Creatinine Clearance: 53.6 mL/min (by C-G formula based on SCr of 0.95 mg/dL). Liver Function Tests: Recent Labs  Lab 06/30/19 1015 07/01/19 0519 07/02/19 0519 07/05/19 0358 07/06/19 0423  AST 44* 31 25 43* 89*  ALT 28 23 20 31  52*  ALKPHOS 113 67 75 74 71  BILITOT 1.4* 1.0 0.8 0.7 1.1  PROT 6.0* 4.9* 5.2* 5.3* 5.9*  ALBUMIN 3.1* 2.5* 2.5* 2.5* 2.6*   No results for input(s): LIPASE, AMYLASE in the last 168 hours. No results for input(s): AMMONIA in the last 168 hours. Coagulation profile Recent Labs  Lab 06/30/19 1015  INR 1.3*    CBC: Recent Labs  Lab 06/30/19 1015 06/30/19 1015 07/01/19 0519 07/02/19 0519 07/03/19 0607 07/05/19 0358 07/06/19 0423  WBC 7.7   < > 11.0* 7.9 7.6 7.8 11.9*  NEUTROABS 7.3  --   --   --   --   --   --   HGB 12.2*   < > 10.9* 11.6* 11.9* 11.7* 11.7*  HCT 36.3*   < > 33.5* 35.0* 36.4* 36.0* 35.8*  MCV 88.5   < > 88.9 88.8 89.4 88.5 88.6  PLT PLATELET CLUMPS NOTED ON SMEAR, UNABLE TO ESTIMATE   < > PLATELET CLUMPS NOTED ON SMEAR, UNABLE TO ESTIMATE PLATELET CLUMPS NOTED ON SMEAR, UNABLE TO ESTIMATE PLATELET CLUMPS NOTED ON SMEAR, UNABLE TO ESTIMATE PLATELET CLUMPS NOTED ON SMEAR, UNABLE TO ESTIMATE PLATELET CLUMPS NOTED ON SMEAR, UNABLE TO ESTIMATE   < > = values in this interval not displayed.   Cardiac Enzymes: No results for input(s): CKTOTAL, CKMB, CKMBINDEX, TROPONINI in the last 168 hours. BNP (last 3 results) No results for input(s): PROBNP in the last 8760 hours. CBG: Recent Labs  Lab  06/30/19 1008 07/02/19 0641 07/03/19 1153 07/03/19 1634 07/05/19 1654  GLUCAP 156* 78 126* 120* 100*   D-Dimer: No results for input(s): DDIMER in the last 72 hours. Hgb A1c: No results for input(s):  HGBA1C in the last 72 hours. Lipid Profile: No results for input(s): CHOL, HDL, LDLCALC, TRIG, CHOLHDL, LDLDIRECT in the last 72 hours. Thyroid function studies: No results for input(s): TSH, T4TOTAL, T3FREE, THYROIDAB in the last 72 hours.  Invalid input(s): FREET3 Anemia work up: No results for input(s): VITAMINB12, FOLATE, FERRITIN, TIBC, IRON, RETICCTPCT in the last 72 hours. Sepsis Labs: Recent Labs  Lab 06/30/19 1202 06/30/19 1429 06/30/19 1743 07/01/19 0519 07/01/19 0757 07/02/19 0519 07/03/19 0607 07/05/19 0358 07/06/19 0423  WBC  --   --   --    < >  --  7.9 7.6 7.8 11.9*  LATICACIDVEN 2.8* 2.0* 2.7*  --  1.5  --   --   --   --    < > = values in this interval not displayed.    Microbiology Recent Results (from the past 240 hour(s))  Blood Culture (routine x 2)     Status: Abnormal   Collection Time: 06/30/19 10:15 AM   Specimen: BLOOD LEFT FOREARM  Result Value Ref Range Status   Specimen Description BLOOD LEFT FOREARM  Final   Special Requests   Final    BOTTLES DRAWN AEROBIC AND ANAEROBIC Blood Culture adequate volume   Culture  Setup Time   Final    GRAM NEGATIVE RODS IN BOTH AEROBIC AND ANAEROBIC BOTTLES CRITICAL RESULT CALLED TO, READ BACK BY AND VERIFIED WITH: Salli Real [id number] 07/01/2019 Mena Goes Performed at [facility], [place] [address] [place], [place] [address]    Culture ENTEROBACTER AEROGENES (A)  Final   Report Status 07/03/2019 FINAL  Final   Organism ID, Bacteria ENTEROBACTER AEROGENES  Final      Susceptibility   Enterobacter aerogenes - MIC*    CEFAZOLIN >=64 RESISTANT Resistant     CEFEPIME <=0.12 SENSITIVE Sensitive     CEFTAZIDIME <=1 SENSITIVE Sensitive     CEFTRIAXONE <=0.25 SENSITIVE Sensitive     CIPROFLOXACIN  <=0.25 SENSITIVE Sensitive     GENTAMICIN <=1 SENSITIVE Sensitive     IMIPENEM 1 SENSITIVE Sensitive     TRIMETH/SULFA <=20 SENSITIVE Sensitive     PIP/TAZO <=4 SENSITIVE Sensitive     * ENTEROBACTER AEROGENES  Urine culture     Status: Abnormal   Collection Time: 06/30/19 10:15 AM   Specimen: In/Out Cath Urine  Result Value Ref Range Status   Specimen Description IN/OUT CATH URINE  Final   Special Requests   Final    NONE Performed at [facility], [place] [address], [place], [place] [address]    Culture (A)  Final    <10,000 COLONIES/mL MULTIPLE SPECIES PRESENT, SUGGEST RECOLLECTION   Report Status 07/01/2019 FINAL  Final  Blood Culture ID Panel (Reflexed)     Status: Abnormal   Collection Time: 06/30/19 10:15 AM  Result Value Ref Range Status   Enterococcus species NOT DETECTED NOT DETECTED Final   Listeria monocytogenes NOT DETECTED NOT DETECTED Final   Staphylococcus species NOT DETECTED NOT DETECTED Final   Staphylococcus aureus (BCID) NOT DETECTED NOT DETECTED Final   Streptococcus species NOT DETECTED NOT DETECTED Final   Streptococcus agalactiae NOT DETECTED NOT DETECTED Final   Streptococcus pneumoniae NOT DETECTED NOT DETECTED Final   Streptococcus pyogenes NOT DETECTED NOT DETECTED Final   Acinetobacter baumannii NOT DETECTED NOT DETECTED Final   Enterobacteriaceae species DETECTED (A) NOT DETECTED Final    Comment: Enterobacteriaceae represent a large family of gram negative bacteria, not a single organism. Refer to culture for further  identification. CRITICAL RESULT CALLED TO, READ BACK BY AND VERIFIED WITH: G. ABBOTT,PHARMD [id number] 07/01/2019 T. TYSOR    Enterobacter cloacae complex NOT DETECTED NOT DETECTED Final   Escherichia coli NOT DETECTED NOT DETECTED Final   Klebsiella oxytoca NOT DETECTED NOT DETECTED Final   Klebsiella pneumoniae NOT DETECTED NOT DETECTED Final   Proteus species NOT DETECTED NOT DETECTED Final   Serratia marcescens NOT DETECTED NOT  DETECTED Final   Carbapenem resistance NOT DETECTED NOT DETECTED Final   Haemophilus influenzae NOT DETECTED NOT DETECTED Final   Neisseria meningitidis NOT DETECTED NOT DETECTED Final   Pseudomonas aeruginosa NOT DETECTED NOT DETECTED Final   Candida albicans NOT DETECTED NOT DETECTED Final   Candida glabrata NOT DETECTED NOT DETECTED Final   Candida krusei NOT DETECTED NOT DETECTED Final   Candida parapsilosis NOT DETECTED NOT DETECTED Final   Candida tropicalis NOT DETECTED NOT DETECTED Final    Comment: Performed at [facility], [place] [address] [place], [place] [address]  Blood Culture (routine x 2)     Status: Abnormal   Collection Time: 06/30/19 10:20 AM   Specimen: BLOOD RIGHT HAND  Result Value Ref Range Status   Specimen Description BLOOD RIGHT HAND  Final   Special Requests   Final    BOTTLES DRAWN AEROBIC AND ANAEROBIC Blood Culture adequate volume   Culture  Setup Time   Final    GRAM NEGATIVE RODS IN BOTH AEROBIC AND ANAEROBIC BOTTLES CRITICAL VALUE NOTED.  VALUE IS CONSISTENT WITH PREVIOUSLY REPORTED AND CALLED VALUE.    Culture (A)  Final    ENTEROBACTER AEROGENES SUSCEPTIBILITIES PERFORMED ON PREVIOUS CULTURE WITHIN THE LAST 5 DAYS. Performed at [facility], [place] [address], [place], [place] [address]    Report Status 07/03/2019 FINAL  Final  Respiratory Panel by RT PCR (Flu A&B, Covid) - Nasopharyngeal Swab     Status: None   Collection Time: 06/30/19 11:00 AM   Specimen: Nasopharyngeal Swab  Result Value Ref Range Status   SARS Coronavirus 2 by RT PCR NEGATIVE NEGATIVE Final    Comment: (NOTE) SARS-CoV-2 target nucleic acids are NOT DETECTED. The SARS-CoV-2 RNA is generally detectable in upper respiratoy specimens during the acute phase of infection. The lowest concentration of SARS-CoV-2 viral copies this assay can detect is 131 copies/mL. A negative result does not preclude SARS-Cov-2 infection and should not be used as the sole basis for  treatment or other patient management decisions. A negative result may occur with  improper specimen collection/handling, submission of specimen other than nasopharyngeal swab, presence of viral mutation(s) within the areas targeted by this assay, and inadequate number of viral copies (<131 copies/mL). A negative result must be combined with clinical observations, patient history, and epidemiological information. The expected result is Negative. Fact Sheet for Patients:  PinkCheek.be Fact Sheet for Healthcare Providers:  GravelBags.it This test is not yet ap proved or cleared by the [place] FDA and  has been authorized for detection and/or diagnosis of SARS-CoV-2 by FDA under an Emergency Use Authorization (EUA). This EUA will remain  in effect (meaning this test can be used) for the duration of the COVID-19 declaration under Section 564(b)(1) of the Act, 21 U.S.C. section 360bbb-3(b)(1), unless the authorization is terminated or revoked sooner.    Influenza A by PCR NEGATIVE NEGATIVE Final   Influenza B by PCR NEGATIVE NEGATIVE Final    Comment: (NOTE) The Xpert Xpress SARS-CoV-2/FLU/RSV assay is intended as an aid in  the diagnosis  of influenza from Nasopharyngeal swab specimens and  should not be used as a sole basis for treatment. Nasal washings and  aspirates are unacceptable for Xpert Xpress SARS-CoV-2/FLU/RSV  testing. Fact Sheet for Patients: PinkCheek.be Fact Sheet for Healthcare Providers: GravelBags.it This test is not yet approved or cleared by the [place] FDA and  has been authorized for detection and/or diagnosis of SARS-CoV-2 by  FDA under an Emergency Use Authorization (EUA). This EUA will remain  in effect (meaning this test can be used) for the duration of the  Covid-19 declaration under Section 564(b)(1) of the Act, 21  U.S.C. section  360bbb-3(b)(1), unless the authorization is  terminated or revoked. Performed at [facility], [place] [address], [place], [place] [address]   Surgical pcr screen     Status: Abnormal   Collection Time: 07/04/19  9:08 PM   Specimen: Nasal Mucosa; Nasal Swab  Result Value Ref Range Status   MRSA, PCR POSITIVE (A) NEGATIVE Final    Comment: RESULT CALLED TO, READ BACK BY AND VERIFIED WITH: E CASTRO RN 07/04/19  2251 JDW    Staphylococcus aureus POSITIVE (A) NEGATIVE Final    Comment: (NOTE) The Xpert SA Assay (FDA approved for NASAL specimens in patients 23 years of age and older), is one component of a comprehensive surveillance program. It is not intended to diagnose infection nor to guide or monitor treatment. Performed at [facility], [place] [address] [place], [place] [address]     Procedures and diagnostic studies:  No results found.  Medications:   . acetaminophen  650 mg Oral Q6H  . aspirin  81 mg Oral QHS  . Chlorhexidine Gluconate Cloth  6 each Topical Q0600  . diclofenac Sodium  2 g Topical QID  . docusate sodium  100 mg Oral BID  . enoxaparin (LOVENOX) injection  40 mg Subcutaneous Q24H  . famotidine  20 mg Oral Daily  . metoprolol succinate  50 mg Oral Daily  . midodrine  5 mg Oral BID WC  . mupirocin ointment  1 application Nasal BID  . OLANZapine  5 mg Oral QHS  . rosuvastatin  10 mg Oral Q M,W,F-2000  . sodium chloride flush  3 mL Intravenous Q12H  . venlafaxine XR  37.5 mg Oral Q breakfast   Continuous Infusions: . sodium chloride 50 mL/hr at 07/04/19 2101  . ceFEPime (MAXIPIME) IV 2 g (07/06/19 0836)  . lactated ringers 10 mL/hr at 07/05/19 1214     LOS: 6 days   Alma Friendly  MD  [facility]  07/06/2019, 5:20 PM

## 2019-07-06 NOTE — TOC Progression Note (Signed)
Transition of Care [facility]) - Progression Note    Patient Details  Name: Jeffrey Underwood MRN: [id number] Date of Birth: 01-22-1929  Transition of [facility]) CM/SW Contact  Bartholomew Crews, RN Phone Number: [phone] 07/06/2019, 11:11 AM  Clinical Narrative:     Discussed patient possible dc home tomorrow with [facility] liaison. [place] to follow up with patient on Friday if patient discharges tomorrow. Patient will need updated HH order for RN, PT, OT with Face to Face. TOC team following for transition needs.    Expected Discharge Plan: [place] Barriers to Discharge: Continued Medical Work up  Expected Discharge Plan and Services Expected Discharge Plan: [place]   Discharge Planning Services: CM Consult Post Acute Care Choice: [place] arrangements for the past 2 months: Single Family Home                                       Social Determinants of Health (SDOH) Interventions    Readmission Risk Interventions No flowsheet data found.

## 2019-07-06 NOTE — Progress Notes (Signed)
Serosanguinous fluid has been draining from JP drain site, notified the Surgery PA.  PA gave order to change the tegaderm dressing to drain guaze dressing and change whenever needed and also told that they are planning to remove JP drain tomorrow.  Changed the dressing, will continue to monitor.

## 2019-07-06 NOTE — Progress Notes (Signed)
PT Progress Note for Charges    07/06/19 1700  PT Visit Information  Last PT Received On 07/06/19  PT General Charges  $$ ACUTE PT VISIT 1 Visit  PT Treatments  $Gait Training 23-37 mins  Anastasio Champion, DPT  Acute Rehabilitation Services Pager [phone] Office [phone]

## 2019-07-06 NOTE — Progress Notes (Signed)
Pharmacy Antibiotic Note  Jeffrey Underwood is a 86 y.o. male admitted on 06/30/2019 with sepsis.  Pharmacy has been consulted for Cefepime dosing.    Blood cultures have been updated to show Enterobacter aerogenes (pan-sensitive except R-Cefazolin). The patient continues on Cefepime which is appropriate and will cover. Given that Enterobacter can produce ampC - would avoid narrowing agents at this time. Renal function is stable and current dosing is appropriate. Likely source is cholecystitis for which patient had a cholecystectomy on 4/13.   D/W Dr. Horris Latino, will likely treat for 10 days (today is day 7).    Plan: Continue Cefepime 2gm IV Q12H Will continue to follow renal function, culture results, LOT, and antibiotic de-escalation plans   Height: 6' (182.9 cm) Weight: 70.5 kg (155 lb 6.8 oz) IBW/kg (Calculated) : 77.6  Temp (24hrs), Avg:98.5 F (36.9 C), Min:97.8 F (36.6 C), Max:99.6 F (37.6 C)  Recent Labs  Lab 06/30/19 1015 06/30/19 1020 06/30/19 1202 06/30/19 1429 06/30/19 1743 07/01/19 0519 07/01/19 0757 07/02/19 0519 07/03/19 0607 07/05/19 0358 07/06/19 0423  WBC   < >  --   --   --   --  11.0*  --  7.9 7.6 7.8 11.9*  CREATININE   < >  --   --   --   --  1.13  --  0.97 0.96 0.96 0.95  LATICACIDVEN  --  2.5* 2.8* 2.0* 2.7*  --  1.5  --   --   --   --    < > = values in this interval not displayed.    Estimated Creatinine Clearance: 53.6 mL/min (by C-G formula based on SCr of 0.95 mg/dL).    Allergies  Allergen Reactions  . Demerol [Meperidine] Shortness Of Breath and Nausea And Vomiting    Vanc 4/8>>4/9 Cefepime 4/8>>  4/8: UCx: <10,000 colonies 4/8: BC x 2: Enterobacter aerogenes (pan-S except R-cefazolin)  Delvin Hedeen A. Levada Dy, PharmD, BCPS, FNKF Clinical Pharmacist [place] Please utilize Amion for appropriate phone number to reach the unit pharmacist ([place])

## 2019-07-07 DIAGNOSIS — K8010 Calculus of gallbladder with chronic cholecystitis without obstruction: Secondary | ICD-10-CM

## 2019-07-07 LAB — COMPREHENSIVE METABOLIC PANEL
ALT: 36 U/L (ref 0–44)
AST: 47 U/L — ABNORMAL HIGH (ref 15–41)
Albumin: 2.2 g/dL — ABNORMAL LOW (ref 3.5–5.0)
Alkaline Phosphatase: 53 U/L (ref 38–126)
Anion gap: 7 (ref 5–15)
BUN: 21 mg/dL (ref 8–23)
CO2: 27 mmol/L (ref 22–32)
Calcium: 7.7 mg/dL — ABNORMAL LOW (ref 8.9–10.3)
Chloride: 105 mmol/L (ref 98–111)
Creatinine, Ser: 1.16 mg/dL (ref 0.61–1.24)
GFR calc Af Amer: >60 mL/min (ref >60)
GFR calc non Af Amer: 56 mL/min — ABNORMAL LOW (ref >60)
Glucose, Bld: 95 mg/dL (ref 70–99)
Potassium: 3.7 mmol/L (ref 3.5–5.1)
Sodium: 139 mmol/L (ref 135–145)
Total Bilirubin: 1 mg/dL (ref 0.3–1.2)
Total Protein: 4.6 g/dL — ABNORMAL LOW (ref 6.5–8.1)

## 2019-07-07 LAB — CBC WITH DIFFERENTIAL/PLATELET
Abs Immature Granulocytes: 0.28 10*3/uL — ABNORMAL HIGH (ref 0.00–0.07)
Basophils Absolute: 0.1 10*3/uL (ref 0.0–0.1)
Basophils Relative: 0 %
Eosinophils Absolute: 0.1 10*3/uL (ref 0.0–0.5)
Eosinophils Relative: 1 %
HCT: 30 % — ABNORMAL LOW (ref 39.0–52.0)
Hemoglobin: 9.9 g/dL — ABNORMAL LOW (ref 13.0–17.0)
Immature Granulocytes: 2 %
Lymphocytes Relative: 19 %
Lymphs Abs: 2.3 10*3/uL (ref 0.7–4.0)
MCH: 29.6 pg (ref 26.0–34.0)
MCHC: 33 g/dL (ref 30.0–36.0)
MCV: 89.6 fL (ref 80.0–100.0)
Monocytes Absolute: 1.3 10*3/uL — ABNORMAL HIGH (ref 0.1–1.0)
Monocytes Relative: 10 %
Neutro Abs: 8.2 10*3/uL — ABNORMAL HIGH (ref 1.7–7.7)
Neutrophils Relative %: 68 %
Platelets: 181 10*3/uL (ref 150–400)
RBC: 3.35 MIL/uL — ABNORMAL LOW (ref 4.22–5.81)
RDW: 14 % (ref 11.5–15.5)
WBC: 12.2 10*3/uL — ABNORMAL HIGH (ref 4.0–10.5)
nRBC: 0 % (ref 0.0–0.2)

## 2019-07-07 LAB — SURGICAL PATHOLOGY

## 2019-07-07 MED ORDER — GLYCERIN (LAXATIVE) 2.1 G RE SUPP
1.0000 | Freq: Once | RECTAL | Status: AC
  Administered 2019-07-07: 1 via RECTAL
  Filled 2019-07-07: qty 1

## 2019-07-07 MED ORDER — CIPROFLOXACIN HCL 500 MG PO TABS: 500.0000 mg | ORAL_TABLET | Freq: Two times a day (BID) | ORAL | 0 refills | Status: AC

## 2019-07-07 MED ORDER — OXYCODONE HCL 5 MG PO TABS: 5.0000 mg | ORAL_TABLET | Freq: Four times a day (QID) | ORAL | 0 refills | Status: AC | PRN

## 2019-07-07 MED ORDER — ACETAMINOPHEN 325 MG PO TABS: 650.0000 mg | ORAL_TABLET | Freq: Four times a day (QID) | ORAL | Status: AC | PRN

## 2019-07-07 MED ORDER — DOCUSATE SODIUM 100 MG PO CAPS: 100.0000 mg | ORAL_CAPSULE | Freq: Two times a day (BID) | ORAL | 0 refills | Status: AC

## 2019-07-07 NOTE — Progress Notes (Signed)
Occupational Therapy Treatment Patient Details Name: Jeffrey Underwood MRN: [id number] DOB: Sep 15, 1928 Today's Date: 07/07/2019    History of present illness Pt is an 86 yo male s/p sepsis, tachycardia, AKI, fever. Pt PMHx: HTN, CAD, MI, BPH, melanoma. S/p laparoscopic cholecystectomy 07/05/19 with JP drain . Cholecystectomy 4/13.    OT comments  Making excellent progress. Able to ambulate @ 200 ft using rollator with 1 seated rest break after sink leve ADL in seated position.  Max HR 112. No DOE. Pt educated on level 1 theraband HEP and need for supervision with ADL and mobility. Educated on strategies to reduce risk of falls. Recommend pt continue with HHOT. Pt very appreciative.   Follow Up Recommendations  Home health OT;Supervision/Assistance - 24 hour    Equipment Recommendations  None recommended by OT    Recommendations for Other Services      Precautions / Restrictions Precautions Precautions: Fall Restrictions Weight Bearing Restrictions: No       Mobility Bed Mobility Overal bed mobility: Modified Independent                Transfers Overall transfer level: Needs assistance   Transfers: Sit to/from Stand;Stand Pivot Transfers Sit to Stand: Min guard Stand pivot transfers: Min guard            Balance Overall balance assessment: Needs assistance   Sitting balance-Leahy Scale: Good       Standing balance-Leahy Scale: Fair                             ADL either performed or assessed with clinical judgement   ADL Overall ADL's : Needs assistance/impaired     Grooming: Set up;Sitting   Upper Body Bathing: Set up;Sitting   Lower Body Bathing: Min guard;Sit to/from stand   Upper Body Dressing : Set up;Sitting   Lower Body Dressing: Minimal assistance;Sit to/from stand   Toilet Transfer: Minimal assistance;Ambulation   Toileting- Clothing Manipulation and Hygiene: Supervision/safety;Sit to/from stand;Sitting/lateral lean        Functional mobility during ADLs: Min guard(rollator) General ADL Comments: Educated onneed to have direct S for all ADL and use shower seat. Pt needs to have daughter present when bathing Pt verbazlied understanding.      Vision       Perception     Praxis      Cognition Arousal/Alertness: Awake/alert Behavior During Therapy: WFL for tasks assessed/performed Overall Cognitive Status: Within Functional Limits for tasks assessed                                          Exercises Exercises: Other exercises Other Exercises Other Exercises: Educated on general theraband seated HEP with level 1 theraband   Shoulder Instructions       General Comments Ambulated @ 200 ft with 1 seated rest break using rollator    Pertinent Vitals/ Pain       Pain Assessment: No/denies pain  Home Living                                          Prior Functioning/Environment              Frequency  Min 2X/week        Progress Toward  Goals  OT Goals(current goals can now be found in the care plan section)  Progress towards OT goals: Progressing toward goals  Acute Rehab OT Goals Patient Stated Goal: to get stronger OT Goal Formulation: With patient/family Time For Goal Achievement: 07/16/19 Potential to Achieve Goals: Good ADL Goals Pt Will Perform Lower Body Dressing: with supervision;sitting/lateral leans;sit to/from stand Pt Will Transfer to Toilet: with supervision;ambulating;bedside commode Pt Will Perform Toileting - Clothing Manipulation and hygiene: with supervision;sitting/lateral leans;sit to/from stand Additional ADL Goal #1: Pt will stand at sink x10 mins for ADL tasks with supervisionA in order to increase activity tolerance. Additional ADL Goal #2: Pt will follow multi step commands consistently with minimal cues to attend to task.  Plan Discharge plan remains appropriate    Co-evaluation                 AM-PAC OT "6  Clicks" Daily Activity     Outcome Measure   Help from another person eating meals?: None Help from another person taking care of personal grooming?: A Little Help from another person toileting, which includes using toliet, bedpan, or urinal?: A Little Help from another person bathing (including washing, rinsing, drying)?: A Little Help from another person to put on and taking off regular upper body clothing?: A Little Help from another person to put on and taking off regular lower body clothing?: A Little 6 Click Score: 19    End of Session Equipment Utilized During Treatment: Gait belt;Other (comment)(rollator)  OT Visit Diagnosis: Unsteadiness on feet (R26.81);Muscle weakness (generalized) (M62.81);Other symptoms and signs involving cognitive function   Activity Tolerance Patient tolerated treatment well   Patient Left in chair;with call bell/phone within reach;with chair alarm set   Nurse Communication Mobility status;Other (comment)(DC needs)        Time: 1030-1102 OT Time Calculation (min): 32 min  Charges: OT General Charges $OT Visit: 1 Visit OT Treatments $Self Care/Home Management : 23-37 mins  Maurie Boettcher, OT/L   Acute OT Clinical Specialist [place] Pager [phone] Office [phone]    [facility] 07/07/2019, 11:43 AM

## 2019-07-07 NOTE — Discharge Summary (Signed)
Discharge Summary  Jeffrey Underwood [id number] DOB: [birth year]  PCP: Josetta Huddle, MD  Admit date: 06/30/2019 Discharge date: 07/07/2019  Time spent: 40 mins  Recommendations for Outpatient Follow-up:  1. PCP in 1 week 2. General surgery as scheduled  Discharge Diagnoses:  Active Hospital Problems   Diagnosis Date Noted  . Sepsis ([place]) 06/30/2019  . Bacteremia 07/01/2019  . Acute metabolic encephalopathy [id number]  . AKI (acute kidney injury) ([place]) 06/30/2019  . Normocytic anemia 06/30/2019  . Hyperlipidemia 06/30/2019  . GERD (gastroesophageal reflux disease) 06/30/2019  . Hypokalemia 06/30/2019  . Cholelithiasis 06/30/2019  . Essential hypertension 06/13/2015    Resolved Hospital Problems  No resolved problems to display.    Discharge Condition: Stable  Diet recommendation: Heart healthy  Vitals:   07/06/19 1952 07/07/19 0442  BP: 114/60 (!) 142/64  Pulse: 80 64  Resp: 20 18  Temp: 97.8 F (36.6 C) (!) 97.3 F (36.3 C)  SpO2: 96% 93%    History of present illness:  Jeffrey Underwood is a very pleasant 86 y.o. male with a past medical hx of HTN, CAD, MI, BPH melanoma presented 4/8 cc ams. Work up reveals sepsis with blood cultures +gm negative rods, fever, elevated lactic acid, tachycardia.  Provided with IV fluids and antibiotics.  Most likely source for gram-negative bacteremia was gallbladder.  General surgery consulted.     Today, patient reports feeling good, denies any new complaints, denies any chest pain, shortness of breath, abdominal pain, nausea/vomiting, fever/chills.  Discussed discharge plans with patient and daughter, who both verbalized understanding.  Patient will be discharged with home health PT/OT, RN.  Hospital Course:  Principal Problem:   Sepsis ([place]) Active Problems:   Essential hypertension   Acute metabolic encephalopathy   AKI (acute kidney injury) ([place])   Normocytic anemia   Hyperlipidemia   GERD (gastroesophageal reflux  disease)   Hypokalemia   Cholelithiasis   Bacteremia   Sepsis with Enterobacter aerogenes bacteremia likely 2/2 cholecystitis Currently afebrile, with leukocytosis Likely source, gallbladder/cholecystitis Blood culture growing Enterobacter Urinalysis negative for infection Chest x-ray unremarkable S/P cefepime X 8 days, will d/c pt on ciprofloxacin for an additional 2 days to complete 10 days of antibiotics Follow-up with PCP with repeat labs in 1 week, to ensure resolution of leukocytosis  Cholecystitis status post lap chole on 07/05/2019 by Dr. Rosendo Gros Follow-up with outpatient general surgery as scheduled  Acute metabolic encephalopathy Resolved Likely secondary to above  Acute kidney injury Resolved  Essential hypertension Continue metoprolol  Normocytic anemia Expected drop in hemoglobin due to recent surgery, in addition to IV fluids Follow-up with PCP with repeat labs to ensure hemoglobin stable  Hyperlipidemia Continue Crestor  Depression Continue the Effexor and olanzapine  GERD Continue Pepcid        Malnutrition Type:      Malnutrition Characteristics:      Nutrition Interventions:      Estimated body mass index is 21.08 kg/m as calculated from the following:   Height as of this encounter: 6' (1.829 m).   Weight as of this encounter: 70.5 kg.    Procedures:  Lap chole  Consultations:  General surgery  Discharge Exam: BP (!) 142/64 (BP Location: Right Arm)   Pulse 64   Temp (!) 97.3 F (36.3 C)   Resp 18   Ht 6' (1.829 m)   Wt 70.5 kg   SpO2 93%   BMI 21.08 kg/m   General: NAD Cardiovascular: S1, S2 present Respiratory: Diminished breath sounds bilaterally Abdomen:  Soft, nontender, lap chole incision sites clean/dry/intact, nondistended, bowel sounds present  Discharge Instructions You were cared for by a hospitalist during your hospital stay. If you have any questions about your discharge medications or the  care you received while you were in the hospital after you are discharged, you can call the unit and asked to speak with the hospitalist on call if the hospitalist that took care of you is not available. Once you are discharged, your primary care physician will handle any further medical issues. Please note that NO REFILLS for any discharge medications will be authorized once you are discharged, as it is imperative that you return to your primary care physician (or establish a relationship with a primary care physician if you do not have one) for your aftercare needs so that they can reassess your need for medications and monitor your lab values.  Discharge Instructions    Diet - low sodium heart healthy   Complete by: As directed    Increase activity slowly   Complete by: As directed      Allergies as of 07/07/2019      Reactions   Demerol [meperidine] Shortness Of Breath, Nausea And Vomiting      Medication List    TAKE these medications   acetaminophen 325 MG tablet Commonly known as: TYLENOL Take 2 tablets (650 mg total) by mouth every 6 (six) hours as needed for mild pain.   aspirin 81 MG tablet Take 81 mg by mouth at bedtime.   cholecalciferol 1000 units tablet Commonly known as: VITAMIN D Take 1,000 Units by mouth daily.   ciprofloxacin 500 MG tablet Commonly known as: Cipro Take 1 tablet (500 mg total) by mouth 2 (two) times daily for 2 days. Start taking on: July 08, 2019   cyanocobalamin 1000 MCG tablet Take 1,000 mcg by mouth at bedtime.   docusate sodium 100 MG capsule Commonly known as: COLACE Take 1 capsule (100 mg total) by mouth 2 (two) times daily.   famotidine 20 MG tablet Commonly known as: PEPCID Take 20 mg by mouth 2 (two) times daily.   Melatonin 200 MCG Tabs Take 1 tablet by mouth at bedtime as needed (sleep). REMfresh   metoprolol succinate 50 MG 24 hr tablet Commonly known as: TOPROL-XL Take 50 mg by mouth daily. Take with or immediately  following a meal. Takes 1/2 tablet daily   midodrine 5 MG tablet Commonly known as: PROAMATINE Take 5 mg by mouth in the morning and at bedtime.   nitroGLYCERIN 0.4 MG SL tablet Commonly known as: NITROSTAT Place 0.4 mg under the tongue every 5 (five) minutes as needed for chest pain. Reported on 06/13/2015   OLANZapine 5 MG tablet Commonly known as: ZYPREXA Take 5 mg by mouth at bedtime.   Omega-3 Krill Oil 300 MG Caps Take 300 mg by mouth daily.   oxyCODONE 5 MG immediate release tablet Commonly known as: Oxy IR/ROXICODONE Take 1 tablet (5 mg total) by mouth every 6 (six) hours as needed for moderate pain.   polyethylene glycol 17 g packet Commonly known as: MIRALAX / GLYCOLAX Take 17 g by mouth daily as needed for mild constipation.   Red Yeast Rice 600 MG Caps Take 600 mg by mouth daily.   rosuvastatin 10 MG tablet Commonly known as: CRESTOR Take 10 mg by mouth 3 (three) times a week. Monday, Wednesday, and Friday at bedtime   SAW PALMETTO PO Take 1 tablet by mouth daily.   Teacher, adult education Extra  Strength 11-10 % Oint Apply 1 application topically daily as needed (shoulder pain).   venlafaxine XR 37.5 MG 24 hr capsule Commonly known as: EFFEXOR-XR Take 37.5 mg by mouth daily.      Allergies  Allergen Reactions  . Demerol [Meperidine] Shortness Of Breath and Nausea And Vomiting   Follow-up Information    [facility] Follow up.   Why: physical therapy and occupational therapy Contact information: [address] [place], [place] [address] [phone]       Surgery, [place]. Go on 07/26/2019.   Specialty: General Surgery Why: Follow up appointment scheduled for 10:00 AM. Please arrive 30 min prior to appointment time. Bring photo ID and insurance information with you.  Contact information: [place] [address] [phone]        Josetta Huddle, MD. Schedule an appointment as soon as possible for a visit in 1  week(s).   Specialty: Internal Medicine Contact information: [address] [place] [address] [phone]            The results of significant diagnostics from this hospitalization (including imaging, microbiology, ancillary and laboratory) are listed below for reference.    Significant Diagnostic Studies: CT Head Wo Contrast  Result Date: 06/30/2019 CLINICAL DATA:  Encephalopathy. EXAM: CT HEAD WITHOUT CONTRAST TECHNIQUE: Contiguous axial images were obtained from the base of the skull through the vertex without intravenous contrast. COMPARISON:  October 29, 2012. FINDINGS: Brain: Mild chronic ischemic white matter disease is noted. No mass effect or midline shift is noted. Ventricular size is within normal limits. There is no evidence of mass lesion, hemorrhage or acute infarction. Vascular: No hyperdense vessel or unexpected calcification. Skull: Normal. Negative for fracture or focal lesion. Sinuses/Orbits: No acute finding. Other: None. IMPRESSION: Mild chronic ischemic white matter disease. No acute intracranial abnormality seen. Electronically Signed   By: Marijo Conception M.D.   On: 06/30/2019 13:16   NM Hepato W/EF  Result Date: 07/04/2019 CLINICAL DATA:  Evaluate gallbladder. Acute cephalopathy. Multiple gallstones within the gallbladder on CT. EXAM: NUCLEAR MEDICINE HEPATOBILIARY IMAGING TECHNIQUE: Sequential images of the abdomen were obtained out to 60 minutes following intravenous administration of radiopharmaceutical. RADIOPHARMACEUTICALS:  5.4 mCi Tc-57m  Choletec IV COMPARISON:  None. FINDINGS: Prompt clearance radiotracer from the blood pool and homogeneous uptake in liver. Counts are evident within the duodenum by 20 minutes. The gallbladder fails to fill over 2.5 hours. Morphine was not administered due to patient's age. IMPRESSION: Non filling of the gallbladder with differential including acute cholecystitis versus chronic cholecystitis. The gallbladder  is collapsed and full of stones on comparison CT without inflammation therefore favor chronic cholecystitis. These results will be called to the ordering clinician or representative by the Radiologist Assistant, and communication documented in the PACS or [employer]. Electronically Signed   By: Suzy Bouchard M.D.   On: 07/04/2019 15:31   US Abdomen Limited  Result Date: 07/03/2019 CLINICAL DATA:  Gram-negative bacteremia EXAM: ULTRASOUND ABDOMEN LIMITED RIGHT UPPER QUADRANT COMPARISON:  CT abdomen/pelvis dated 06/26/2019 FINDINGS: Gallbladder: Surgically absent. Common bile duct: Diameter: Not discretely visualized. Liver: Poorly visualized. At the upper limits of normal for parenchymal echogenicity. No focal hepatic lesion is seen. Portal vein is patent on color Doppler imaging with normal direction of blood flow towards the liver. Other: None. IMPRESSION: Limited evaluation. Liver is poorly visualized but grossly unremarkable. Status post cholecystectomy. Electronically Signed   By: Henderson Newcomer.D.  On: 07/03/2019 20:21   DG Chest Port 1 View  Result Date: 06/30/2019 CLINICAL DATA:  Sepsis, restless with fever EXAM: PORTABLE CHEST 1 VIEW COMPARISON:  06/26/2019 FINDINGS: Cardiomediastinal contours and hilar structures are normal. Lungs are clear. No acute bone finding. Spinal degenerative changes. IMPRESSION: No acute cardiopulmonary disease. Electronically Signed   By: Zetta Bills M.D.   On: 06/30/2019 10:53   ECHOCARDIOGRAM COMPLETE  Result Date: 07/02/2019    ECHOCARDIOGRAM REPORT   Patient Name:   Jeffrey Underwood Date of Exam: 07/02/2019 Medical Rec #:  [id number]      Height:       72.0 in Accession #:    [id number]     Weight:       150.0 lb Date of Birth:  22-Dec-1928     BSA:          1.885 m Patient Age:    70 years       BP:           139/81 mmHg Patient Gender: M              HR:           88 bpm. Exam Location:  Inpatient Procedure: 2D Echo, Cardiac Doppler and Color Doppler  Indications:    R01.1 Murmur  History:        Patient has no prior history of Echocardiogram examinations.                 Previous Myocardial Infarction, Signs/Symptoms:Murmur and                 Bacteremia; Risk Factors:Hypertension and Dyslipidemia.  Sonographer:    Roseanna Rainbow RDCS Referring Phys: [place] Comments: Technically difficult study due to poor echo windows. Image acquisition challenging due to respiratory motion. IMPRESSIONS  1. Left ventricular ejection fraction, by estimation, is 55 to 60%. The left ventricle has normal function. The left ventricle has no regional wall motion abnormalities. There is severe asymmetric left ventricular hypertrophy. Left ventricular diastolic  parameters are consistent with Grade I diastolic dysfunction (impaired relaxation).  2. Right ventricular systolic function is normal. The right ventricular size is normal.  3. The mitral valve is grossly normal. No evidence of mitral valve regurgitation. No evidence of mitral stenosis.  4. The aortic valve is tricuspid. Aortic valve regurgitation is not visualized. Moderate aortic valve stenosis. FINDINGS  Left Ventricle: Left ventricular ejection fraction, by estimation, is 55 to 60%. The left ventricle has normal function. The left ventricle has no regional wall motion abnormalities. The left ventricular internal cavity size was normal in size. There is  severe asymmetric left ventricular hypertrophy. Left ventricular diastolic parameters are consistent with Grade I diastolic dysfunction (impaired relaxation). Right Ventricle: The right ventricular size is normal. No increase in right ventricular wall thickness. Right ventricular systolic function is normal. Left Atrium: Left atrial size was normal in size. Right Atrium: Right atrial size was normal in size. Pericardium: There is no evidence of pericardial effusion. Mitral Valve: The mitral valve is grossly normal. No evidence of mitral valve  regurgitation. No evidence of mitral valve stenosis. Tricuspid Valve: The tricuspid valve is normal in structure. Tricuspid valve regurgitation is trivial. No evidence of tricuspid stenosis. Aortic Valve: The aortic valve is tricuspid. . There is moderate thickening and moderate calcification of the aortic valve. Aortic valve regurgitation is not visualized. Moderate aortic stenosis is present. Moderate aortic valve annular calcification. There is moderate thickening  of the aortic valve. There is moderate calcification of the aortic valve. Aortic valve mean gradient measures 23.6 mmHg. Aortic valve peak gradient measures 41.0 mmHg. Aortic valve area, by VTI measures 1.43 cm. Pulmonic Valve: The pulmonic valve was grossly normal. Pulmonic valve regurgitation is not visualized. No evidence of pulmonic stenosis. Aorta: The aortic root and ascending aorta are structurally normal, with no evidence of dilitation. IAS/Shunts: The atrial septum is grossly normal.  LEFT VENTRICLE PLAX 2D LVIDd:         3.90 cm     Diastology LVIDs:         3.00 cm     LV e' lateral:   7.07 cm/s LV PW:         1.45 cm     LV E/e' lateral: 12.5 LV IVS:        1.70 cm     LV e' medial:    5.33 cm/s LVOT diam:     2.20 cm     LV E/e' medial:  16.5 LV SV:         90 LV SV Index:   48 LVOT Area:     3.80 cm  LV Volumes (MOD) LV vol d, MOD A2C: 69.2 ml LV vol d, MOD A4C: 36.4 ml LV vol s, MOD A2C: 26.9 ml LV vol s, MOD A4C: 14.2 ml LV SV MOD A2C:     42.3 ml LV SV MOD A4C:     36.4 ml LV SV MOD BP:      31.0 ml RIGHT VENTRICLE RV S prime:     10.90 cm/s TAPSE (M-mode): 1.9 cm LEFT ATRIUM             Index       RIGHT ATRIUM           Index LA diam:        3.10 cm 1.64 cm/m  RA Area:     15.70 cm LA Vol (A2C):   37.9 ml 20.10 ml/m RA Volume:   40.40 ml  21.43 ml/m LA Vol (A4C):   23.5 ml 12.46 ml/m LA Biplane Vol: 33.1 ml 17.56 ml/m  AORTIC VALVE AV Area (Vmax):    1.77 cm AV Area (Vmean):   1.57 cm AV Area (VTI):     1.43 cm AV Vmax:            320.00 cm/s AV Vmean:          230.800 cm/s AV VTI:            0.628 m AV Peak Grad:      41.0 mmHg AV Mean Grad:      23.6 mmHg LVOT Vmax:         149.00 cm/s LVOT Vmean:        95.100 cm/s LVOT VTI:          0.237 m LVOT/AV VTI ratio: 0.38  AORTA Ao Root diam: 3.30 cm MITRAL VALVE MV Area (PHT): 4.15 cm     SHUNTS MV Decel Time: 183 msec     Systemic VTI:  0.24 m MV E velocity: 88.10 cm/s   Systemic Diam: 2.20 cm MV A velocity: 121.00 cm/s MV E/A ratio:  0.73 Mertie Moores MD Electronically signed by Mertie Moores MD Signature Date/Time: 07/02/2019/4:04:24 PM    Final     Microbiology: Recent Results (from the past 240 hour(s))  Blood Culture (routine x 2)     Status: Abnormal   Collection Time:  06/30/19 10:15 AM   Specimen: BLOOD LEFT FOREARM  Result Value Ref Range Status   Specimen Description BLOOD LEFT FOREARM  Final   Special Requests   Final    BOTTLES DRAWN AEROBIC AND ANAEROBIC Blood Culture adequate volume   Culture  Setup Time   Final    GRAM NEGATIVE RODS IN BOTH AEROBIC AND ANAEROBIC BOTTLES CRITICAL RESULT CALLED TO, READ BACK BY AND VERIFIED WITH: Salli Real [id number] 07/01/2019 Mena Goes Performed at [facility], [place] [address] [place], [place] [address]    Culture ENTEROBACTER AEROGENES (A)  Final   Report Status 07/03/2019 FINAL  Final   Organism ID, Bacteria ENTEROBACTER AEROGENES  Final      Susceptibility   Enterobacter aerogenes - MIC*    CEFAZOLIN >=64 RESISTANT Resistant     CEFEPIME <=0.12 SENSITIVE Sensitive     CEFTAZIDIME <=1 SENSITIVE Sensitive     CEFTRIAXONE <=0.25 SENSITIVE Sensitive     CIPROFLOXACIN <=0.25 SENSITIVE Sensitive     GENTAMICIN <=1 SENSITIVE Sensitive     IMIPENEM 1 SENSITIVE Sensitive     TRIMETH/SULFA <=20 SENSITIVE Sensitive     PIP/TAZO <=4 SENSITIVE Sensitive     * ENTEROBACTER AEROGENES  Urine culture     Status: Abnormal   Collection Time: 06/30/19 10:15 AM   Specimen: In/Out Cath Urine  Result Value Ref Range  Status   Specimen Description IN/OUT CATH URINE  Final   Special Requests   Final    NONE Performed at [facility], [place] [address] [place], [place] [address]    Culture (A)  Final    <10,000 COLONIES/mL MULTIPLE SPECIES PRESENT, SUGGEST RECOLLECTION   Report Status 07/01/2019 FINAL  Final  Blood Culture ID Panel (Reflexed)     Status: Abnormal   Collection Time: 06/30/19 10:15 AM  Result Value Ref Range Status   Enterococcus species NOT DETECTED NOT DETECTED Final   Listeria monocytogenes NOT DETECTED NOT DETECTED Final   Staphylococcus species NOT DETECTED NOT DETECTED Final   Staphylococcus aureus (BCID) NOT DETECTED NOT DETECTED Final   Streptococcus species NOT DETECTED NOT DETECTED Final   Streptococcus agalactiae NOT DETECTED NOT DETECTED Final   Streptococcus pneumoniae NOT DETECTED NOT DETECTED Final   Streptococcus pyogenes NOT DETECTED NOT DETECTED Final   Acinetobacter baumannii NOT DETECTED NOT DETECTED Final   Enterobacteriaceae species DETECTED (A) NOT DETECTED Final    Comment: Enterobacteriaceae represent a large family of gram negative bacteria, not a single organism. Refer to culture for further identification. CRITICAL RESULT CALLED TO, READ BACK BY AND VERIFIED WITH: G. ABBOTT,PHARMD [id number] 07/01/2019 T. TYSOR    Enterobacter cloacae complex NOT DETECTED NOT DETECTED Final   Escherichia coli NOT DETECTED NOT DETECTED Final   Klebsiella oxytoca NOT DETECTED NOT DETECTED Final   Klebsiella pneumoniae NOT DETECTED NOT DETECTED Final   Proteus species NOT DETECTED NOT DETECTED Final   Serratia marcescens NOT DETECTED NOT DETECTED Final   Carbapenem resistance NOT DETECTED NOT DETECTED Final   Haemophilus influenzae NOT DETECTED NOT DETECTED Final   Neisseria meningitidis NOT DETECTED NOT DETECTED Final   Pseudomonas aeruginosa NOT DETECTED NOT DETECTED Final   Candida albicans NOT DETECTED NOT DETECTED Final   Candida glabrata NOT DETECTED NOT DETECTED Final    Candida krusei NOT DETECTED NOT DETECTED Final   Candida parapsilosis NOT DETECTED NOT DETECTED Final   Candida tropicalis NOT DETECTED NOT DETECTED Final    Comment: Performed at [facility], [address] [place], [place] [address]  Blood Culture (routine x 2)     Status: Abnormal   Collection Time: 06/30/19 10:20 AM   Specimen: BLOOD RIGHT HAND  Result Value Ref Range Status   Specimen Description BLOOD RIGHT HAND  Final   Special Requests   Final    BOTTLES DRAWN AEROBIC AND ANAEROBIC Blood Culture adequate volume   Culture  Setup Time   Final    GRAM NEGATIVE RODS IN BOTH AEROBIC AND ANAEROBIC BOTTLES CRITICAL VALUE NOTED.  VALUE IS CONSISTENT WITH PREVIOUSLY REPORTED AND CALLED VALUE.    Culture (A)  Final    ENTEROBACTER AEROGENES SUSCEPTIBILITIES PERFORMED ON PREVIOUS CULTURE WITHIN THE LAST 5 DAYS. Performed at [facility], [place] [address], [place], [place] [address]    Report Status 07/03/2019 FINAL  Final  Respiratory Panel by RT PCR (Flu A&B, Covid) - Nasopharyngeal Swab     Status: None   Collection Time: 06/30/19 11:00 AM   Specimen: Nasopharyngeal Swab  Result Value Ref Range Status   SARS Coronavirus 2 by RT PCR NEGATIVE NEGATIVE Final    Comment: (NOTE) SARS-CoV-2 target nucleic acids are NOT DETECTED. The SARS-CoV-2 RNA is generally detectable in upper respiratoy specimens during the acute phase of infection. The lowest concentration of SARS-CoV-2 viral copies this assay can detect is 131 copies/mL. A negative result does not preclude SARS-Cov-2 infection and should not be used as the sole basis for treatment or other patient management decisions. A negative result may occur with  improper specimen collection/handling, submission of specimen other than nasopharyngeal swab, presence of viral mutation(s) within the areas targeted by this assay, and inadequate number of viral copies (<131 copies/mL). A negative result must be combined with  clinical observations, patient history, and epidemiological information. The expected result is Negative. Fact Sheet for Patients:  PinkCheek.be Fact Sheet for Healthcare Providers:  GravelBags.it This test is not yet ap proved or cleared by the [place] FDA and  has been authorized for detection and/or diagnosis of SARS-CoV-2 by FDA under an Emergency Use Authorization (EUA). This EUA will remain  in effect (meaning this test can be used) for the duration of the COVID-19 declaration under Section 564(b)(1) of the Act, 21 U.S.C. section 360bbb-3(b)(1), unless the authorization is terminated or revoked sooner.    Influenza A by PCR NEGATIVE NEGATIVE Final   Influenza B by PCR NEGATIVE NEGATIVE Final    Comment: (NOTE) The Xpert Xpress SARS-CoV-2/FLU/RSV assay is intended as an aid in  the diagnosis of influenza from Nasopharyngeal swab specimens and  should not be used as a sole basis for treatment. Nasal washings and  aspirates are unacceptable for Xpert Xpress SARS-CoV-2/FLU/RSV  testing. Fact Sheet for Patients: PinkCheek.be Fact Sheet for Healthcare Providers: GravelBags.it This test is not yet approved or cleared by the [place] FDA and  has been authorized for detection and/or diagnosis of SARS-CoV-2 by  FDA under an Emergency Use Authorization (EUA). This EUA will remain  in effect (meaning this test can be used) for the duration of the  Covid-19 declaration under Section 564(b)(1) of the Act, 21  U.S.C. section 360bbb-3(b)(1), unless the authorization is  terminated or revoked. Performed at [facility], [place] [address], [place], [place] [address]   Surgical pcr screen     Status: Abnormal   Collection Time: 07/04/19  9:08 PM   Specimen: Nasal Mucosa; Nasal Swab  Result Value Ref Range Status   MRSA, PCR POSITIVE (A)  NEGATIVE Final     Comment: RESULT CALLED TO, READ BACK BY AND VERIFIED WITH: E CASTRO RN 07/04/19  2251 JDW    Staphylococcus aureus POSITIVE (A) NEGATIVE Final    Comment: (NOTE) The Xpert SA Assay (FDA approved for NASAL specimens in patients 45 years of age and older), is one component of a comprehensive surveillance program. It is not intended to diagnose infection nor to guide or monitor treatment. Performed at [facility], [place] [address] [place], [place] [address]      Labs: Basic Metabolic Panel: Recent Labs  Lab 07/02/19 0519 07/03/19 0607 07/05/19 0358 07/06/19 0423 07/07/19 0319  NA 141 141 141 139 139  K 3.5 3.5 4.5 4.5 3.7  CL 107 107 104 104 105  CO2 24 27 30  20* 27  GLUCOSE 88 88 108* 146* 95  BUN 20 14 16 17 21   CREATININE 0.97 0.96 0.96 0.95 1.16  CALCIUM 7.8* 8.2* 8.5* 8.2* 7.7*   Liver Function Tests: Recent Labs  Lab 07/01/19 0519 07/02/19 0519 07/05/19 0358 07/06/19 0423 07/07/19 0319  AST 31 25 43* 89* 47*  ALT 23 20 31  52* 36  ALKPHOS 67 75 74 71 53  BILITOT 1.0 0.8 0.7 1.1 1.0  PROT 4.9* 5.2* 5.3* 5.9* 4.6*  ALBUMIN 2.5* 2.5* 2.5* 2.6* 2.2*   No results for input(s): LIPASE, AMYLASE in the last 168 hours. No results for input(s): AMMONIA in the last 168 hours. CBC: Recent Labs  Lab 07/02/19 0519 07/03/19 0607 07/05/19 0358 07/06/19 0423 07/07/19 0319  WBC 7.9 7.6 7.8 11.9* 12.2*  NEUTROABS  --   --   --   --  8.2*  HGB 11.6* 11.9* 11.7* 11.7* 9.9*  HCT 35.0* 36.4* 36.0* 35.8* 30.0*  MCV 88.8 89.4 88.5 88.6 89.6  PLT PLATELET CLUMPS NOTED ON SMEAR, UNABLE TO ESTIMATE PLATELET CLUMPS NOTED ON SMEAR, UNABLE TO ESTIMATE PLATELET CLUMPS NOTED ON SMEAR, UNABLE TO ESTIMATE PLATELET CLUMPS NOTED ON SMEAR, UNABLE TO ESTIMATE 181   Cardiac Enzymes: No results for input(s): CKTOTAL, CKMB, CKMBINDEX, TROPONINI in the last 168 hours. BNP: BNP (last 3 results) No results for input(s): BNP in the last 8760 hours.  ProBNP (last 3 results) No results  for input(s): PROBNP in the last 8760 hours.  CBG: Recent Labs  Lab 07/02/19 0641 07/03/19 1153 07/03/19 1634 07/05/19 1654  GLUCAP 78 126* 120* 100*       Signed:  Alma Friendly, MD [facility] 07/07/2019, 11:59 AM

## 2019-07-07 NOTE — Progress Notes (Signed)
[facility] Progress Note  2 Days Post-Op  Subjective: Patient reports pain improved from yesterday. +flatus, unsure if he has had a BM. Denies nausea or feeling bloated. Had some drainage around JP overnight. Patient reports he has been coughing a little but denies chest pain or SOB.   Objective: Vital signs in last 24 hours: Temp:  [97.3 F (36.3 C)-97.8 F (36.6 C)] 97.3 F (36.3 C) (04/15 0442) Pulse Rate:  [64-84] 64 (04/15 0442) Resp:  [18-20] 18 (04/15 0442) BP: (114-159)/(60-86) 142/64 (04/15 0442) SpO2:  [93 %-96 %] 93 % (04/15 0442) Last BM Date: 07/05/19  Intake/Output from previous day: 04/14 0701 - 04/15 0700 In: 1040 [P.O.:240; I.V.:600; IV Piggyback:200] Out: 1015 [Urine:925; Drains:90] Intake/Output this shift: Total I/O In: -  Out: 300 [Urine:300]  PE: General: pleasant, WD, WN white male who is laying in bed in NAD HEENT: Sclera are anicteric. PERRL. Ears and nose without any masses or lesions. Mouth is pink and moist Heart: regular, rate, and rhythm.Systolic murmur heard. Palpable radial and pedal pulses bilaterally Lungs: CTAB, no wheezes, rhonchi, or rales noted. Respiratory effort nonlabored Abd: soft, non-tender, ND, +BS, incisions c/d/i, drain in RUQ with SS fluid MS: all 4 extremities are symmetrical with no cyanosis, clubbing, or edema. Skin: warm and dry with no masses, lesions, or rashes   Lab Results:  Recent Labs    07/06/19 0423 07/07/19 0319  WBC 11.9* 12.2*  HGB 11.7* 9.9*  HCT 35.8* 30.0*  PLT PLATELET CLUMPS NOTED ON SMEAR, UNABLE TO ESTIMATE 181   BMET Recent Labs    07/06/19 0423 07/07/19 0319  NA 139 139  K 4.5 3.7  CL 104 105  CO2 20* 27  GLUCOSE 146* 95  BUN 17 21  CREATININE 0.95 1.16  CALCIUM 8.2* 7.7*   PT/INR No results for input(s): LABPROT, INR in the last 72 hours. CMP     Component Value Date/Time   NA 139 07/07/2019 0319   K 3.7 07/07/2019 0319   CL 105 07/07/2019 0319   CO2 27  07/07/2019 0319   GLUCOSE 95 07/07/2019 0319   BUN 21 07/07/2019 0319   CREATININE 1.16 07/07/2019 0319   CALCIUM 7.7 (L) 07/07/2019 0319   PROT 4.6 (L) 07/07/2019 0319   ALBUMIN 2.2 (L) 07/07/2019 0319   AST 47 (H) 07/07/2019 0319   ALT 36 07/07/2019 0319   ALKPHOS 53 07/07/2019 0319   BILITOT 1.0 07/07/2019 0319   GFRNONAA 56 (L) 07/07/2019 0319   GFRAA >60 07/07/2019 0319   Lipase  No results found for: LIPASE     Studies/Results: No results found.  Anti-infectives: Anti-infectives (From admission, onward)   Start     Dose/Rate Route Frequency Ordered Stop   07/01/19 1100  vancomycin (VANCOREADY) IVPB 750 mg/150 mL  Status:  Discontinued     750 mg 150 mL/hr over 60 Minutes Intravenous Every 24 hours 06/30/19 1111 07/01/19 0515   06/30/19 2230  ceFEPIme (MAXIPIME) 2 g in sodium chloride 0.9 % 100 mL IVPB     2 g 200 mL/hr over 30 Minutes Intravenous Every 12 hours 06/30/19 1111     06/30/19 1030  ceFEPIme (MAXIPIME) 2 g in sodium chloride 0.9 % 100 mL IVPB     2 g 200 mL/hr over 30 Minutes Intravenous  Once 06/30/19 1018 06/30/19 1058   06/30/19 1030  vancomycin (VANCOCIN) IVPB 1000 mg/200 mL premix     1,000 mg 200 mL/hr over 60 Minutes Intravenous  Once 06/30/19  1018 06/30/19 1157       Assessment/Plan CAD, H/O MI- ECHO with EF 55-60% HTN AKI, resolved Acute metabolic encephalopathy, resolved BPH Melanoma  Enterobacter bacteremia secondary to cholecystitis S/p laparoscopic cholecystectomy 07/05/19 Dr. Rosendo Gros - POD#2 - pain improved and abdomen soft, and ND - IS - mobilize today - removed JP  - stable for discharge from a surgical standpoint, follow up in chart already  FEN -HH/CM diet, IVF VTE -lovenox ID -[email or web address]  LOS: 7 days    [name] , [facility] 07/07/2019, 8:33 AM Please see Amion for pager number during day hours 7:00am-4:30pm

## 2019-07-07 NOTE — TOC Transition Note (Signed)
Transition of Care [facility]) - CM/SW Discharge Note   Patient Details  Name: Jeffrey Underwood MRN: [id number] Date of Birth: [birth year]  [facility] CM/SW Contact:  Bartholomew Crews, RN Phone Number: [phone] 07/07/2019, 12:16 PM   Clinical Narrative:     Patient ready to transition home. HH orders updated for RN, PT, OT. Liaison at [facility] notified. No further TOC needs identified at this time.   Final next level of care: Home w Home Health Services Barriers to Discharge: No Barriers Identified   Patient Goals and CMS Choice Patient states their goals for this hospitalization and ongoing recovery are:: return home with daughters and get back to his old self CMS Medicare.gov Compare Post Acute Care list provided to:: Patient Choice offered to / list presented to : Patient, Adult Children  Discharge Placement                       Discharge Plan and Services   Discharge Planning Services: CM Consult Post Acute Care Choice: Home Health                    HH Arranged: RN, PT, OT [facility] Agency: [place] Date [place]: 07/07/19 Time HH Agency Contacted: [id number] Representative spoke with at [place]: [place] ([place]) Interventions     Readmission Risk Interventions No flowsheet data found.

## 2019-07-07 NOTE — Progress Notes (Signed)
Jeffrey Underwood to be discharged Home per MD order. Discussed prescriptions and follow up appointments with the patient. Prescriptions explained to patient; medication list explained in detail. Patient's daughter verbalized understanding.  Skin clean, dry and intact without evidence of skin break down, no evidence of skin tears noted. IV catheter discontinued intact. Site without signs and symptoms of complications. Dressing and pressure applied. Pt denies pain at the site currently. No complaints noted.  Patient free of lines, drains, and wounds.   An After Visit Summary (AVS) was printed and given to the patient. Patient escorted via wheelchair, and discharged home via private auto.  Amaryllis Dyke, RN

## 2019-07-08 DIAGNOSIS — C439 Malignant melanoma of skin, unspecified: Secondary | ICD-10-CM | POA: Diagnosis not present

## 2019-07-08 DIAGNOSIS — D649 Anemia, unspecified: Secondary | ICD-10-CM | POA: Diagnosis not present

## 2019-07-08 DIAGNOSIS — I2510 Atherosclerotic heart disease of native coronary artery without angina pectoris: Secondary | ICD-10-CM | POA: Diagnosis not present

## 2019-07-08 DIAGNOSIS — E785 Hyperlipidemia, unspecified: Secondary | ICD-10-CM | POA: Diagnosis not present

## 2019-07-08 DIAGNOSIS — Z9049 Acquired absence of other specified parts of digestive tract: Secondary | ICD-10-CM | POA: Diagnosis not present

## 2019-07-08 DIAGNOSIS — F329 Major depressive disorder, single episode, unspecified: Secondary | ICD-10-CM | POA: Diagnosis not present

## 2019-07-08 DIAGNOSIS — N400 Enlarged prostate without lower urinary tract symptoms: Secondary | ICD-10-CM | POA: Diagnosis not present

## 2019-07-08 DIAGNOSIS — Z48815 Encounter for surgical aftercare following surgery on the digestive system: Secondary | ICD-10-CM | POA: Diagnosis not present

## 2019-07-08 DIAGNOSIS — K5900 Constipation, unspecified: Secondary | ICD-10-CM | POA: Diagnosis not present

## 2019-07-08 DIAGNOSIS — I10 Essential (primary) hypertension: Secondary | ICD-10-CM | POA: Diagnosis not present

## 2019-07-08 DIAGNOSIS — Z7982 Long term (current) use of aspirin: Secondary | ICD-10-CM | POA: Diagnosis not present

## 2019-07-08 DIAGNOSIS — I959 Hypotension, unspecified: Secondary | ICD-10-CM | POA: Diagnosis not present

## 2019-07-08 DIAGNOSIS — I252 Old myocardial infarction: Secondary | ICD-10-CM | POA: Diagnosis not present

## 2019-07-08 DIAGNOSIS — K219 Gastro-esophageal reflux disease without esophagitis: Secondary | ICD-10-CM | POA: Diagnosis not present

## 2019-07-11 DIAGNOSIS — I2510 Atherosclerotic heart disease of native coronary artery without angina pectoris: Secondary | ICD-10-CM | POA: Diagnosis not present

## 2019-07-11 DIAGNOSIS — E785 Hyperlipidemia, unspecified: Secondary | ICD-10-CM | POA: Diagnosis not present

## 2019-07-11 DIAGNOSIS — C439 Malignant melanoma of skin, unspecified: Secondary | ICD-10-CM | POA: Diagnosis not present

## 2019-07-11 DIAGNOSIS — I10 Essential (primary) hypertension: Secondary | ICD-10-CM | POA: Diagnosis not present

## 2019-07-11 DIAGNOSIS — Z48815 Encounter for surgical aftercare following surgery on the digestive system: Secondary | ICD-10-CM | POA: Diagnosis not present

## 2019-07-11 DIAGNOSIS — I252 Old myocardial infarction: Secondary | ICD-10-CM | POA: Diagnosis not present

## 2019-07-13 DIAGNOSIS — I2510 Atherosclerotic heart disease of native coronary artery without angina pectoris: Secondary | ICD-10-CM | POA: Diagnosis not present

## 2019-07-13 DIAGNOSIS — R350 Frequency of micturition: Secondary | ICD-10-CM | POA: Diagnosis not present

## 2019-07-14 DIAGNOSIS — Z48815 Encounter for surgical aftercare following surgery on the digestive system: Secondary | ICD-10-CM | POA: Diagnosis not present

## 2019-07-14 DIAGNOSIS — I10 Essential (primary) hypertension: Secondary | ICD-10-CM | POA: Diagnosis not present

## 2019-07-14 DIAGNOSIS — C439 Malignant melanoma of skin, unspecified: Secondary | ICD-10-CM | POA: Diagnosis not present

## 2019-07-14 DIAGNOSIS — I252 Old myocardial infarction: Secondary | ICD-10-CM | POA: Diagnosis not present

## 2019-07-14 DIAGNOSIS — I2510 Atherosclerotic heart disease of native coronary artery without angina pectoris: Secondary | ICD-10-CM | POA: Diagnosis not present

## 2019-07-14 DIAGNOSIS — E785 Hyperlipidemia, unspecified: Secondary | ICD-10-CM | POA: Diagnosis not present

## 2019-07-18 DIAGNOSIS — I10 Essential (primary) hypertension: Secondary | ICD-10-CM | POA: Diagnosis not present

## 2019-07-18 DIAGNOSIS — E785 Hyperlipidemia, unspecified: Secondary | ICD-10-CM | POA: Diagnosis not present

## 2019-07-18 DIAGNOSIS — C439 Malignant melanoma of skin, unspecified: Secondary | ICD-10-CM | POA: Diagnosis not present

## 2019-07-18 DIAGNOSIS — I252 Old myocardial infarction: Secondary | ICD-10-CM | POA: Diagnosis not present

## 2019-07-18 DIAGNOSIS — Z48815 Encounter for surgical aftercare following surgery on the digestive system: Secondary | ICD-10-CM | POA: Diagnosis not present

## 2019-07-18 DIAGNOSIS — I2510 Atherosclerotic heart disease of native coronary artery without angina pectoris: Secondary | ICD-10-CM | POA: Diagnosis not present

## 2019-07-19 DIAGNOSIS — I10 Essential (primary) hypertension: Secondary | ICD-10-CM | POA: Diagnosis not present

## 2019-07-19 DIAGNOSIS — I252 Old myocardial infarction: Secondary | ICD-10-CM | POA: Diagnosis not present

## 2019-07-19 DIAGNOSIS — Z48815 Encounter for surgical aftercare following surgery on the digestive system: Secondary | ICD-10-CM | POA: Diagnosis not present

## 2019-07-19 DIAGNOSIS — C439 Malignant melanoma of skin, unspecified: Secondary | ICD-10-CM | POA: Diagnosis not present

## 2019-07-19 DIAGNOSIS — E785 Hyperlipidemia, unspecified: Secondary | ICD-10-CM | POA: Diagnosis not present

## 2019-07-19 DIAGNOSIS — I2510 Atherosclerotic heart disease of native coronary artery without angina pectoris: Secondary | ICD-10-CM | POA: Diagnosis not present

## 2019-07-20 DIAGNOSIS — Z48815 Encounter for surgical aftercare following surgery on the digestive system: Secondary | ICD-10-CM | POA: Diagnosis not present

## 2019-07-20 DIAGNOSIS — I10 Essential (primary) hypertension: Secondary | ICD-10-CM | POA: Diagnosis not present

## 2019-07-20 DIAGNOSIS — E785 Hyperlipidemia, unspecified: Secondary | ICD-10-CM | POA: Diagnosis not present

## 2019-07-20 DIAGNOSIS — I252 Old myocardial infarction: Secondary | ICD-10-CM | POA: Diagnosis not present

## 2019-07-20 DIAGNOSIS — I2510 Atherosclerotic heart disease of native coronary artery without angina pectoris: Secondary | ICD-10-CM | POA: Diagnosis not present

## 2019-07-20 DIAGNOSIS — C439 Malignant melanoma of skin, unspecified: Secondary | ICD-10-CM | POA: Diagnosis not present

## 2019-07-21 DIAGNOSIS — C439 Malignant melanoma of skin, unspecified: Secondary | ICD-10-CM | POA: Diagnosis not present

## 2019-07-21 DIAGNOSIS — I10 Essential (primary) hypertension: Secondary | ICD-10-CM | POA: Diagnosis not present

## 2019-07-21 DIAGNOSIS — I252 Old myocardial infarction: Secondary | ICD-10-CM | POA: Diagnosis not present

## 2019-07-21 DIAGNOSIS — D649 Anemia, unspecified: Secondary | ICD-10-CM | POA: Diagnosis not present

## 2019-07-21 DIAGNOSIS — E785 Hyperlipidemia, unspecified: Secondary | ICD-10-CM | POA: Diagnosis not present

## 2019-07-21 DIAGNOSIS — F329 Major depressive disorder, single episode, unspecified: Secondary | ICD-10-CM | POA: Diagnosis not present

## 2019-07-21 DIAGNOSIS — Z48815 Encounter for surgical aftercare following surgery on the digestive system: Secondary | ICD-10-CM | POA: Diagnosis not present

## 2019-07-21 DIAGNOSIS — N400 Enlarged prostate without lower urinary tract symptoms: Secondary | ICD-10-CM | POA: Diagnosis not present

## 2019-07-21 DIAGNOSIS — K5900 Constipation, unspecified: Secondary | ICD-10-CM | POA: Diagnosis not present

## 2019-07-21 DIAGNOSIS — I959 Hypotension, unspecified: Secondary | ICD-10-CM | POA: Diagnosis not present

## 2019-07-21 DIAGNOSIS — K219 Gastro-esophageal reflux disease without esophagitis: Secondary | ICD-10-CM | POA: Diagnosis not present

## 2019-07-21 DIAGNOSIS — I2510 Atherosclerotic heart disease of native coronary artery without angina pectoris: Secondary | ICD-10-CM | POA: Diagnosis not present

## 2019-07-27 DIAGNOSIS — Z48815 Encounter for surgical aftercare following surgery on the digestive system: Secondary | ICD-10-CM | POA: Diagnosis not present

## 2019-07-27 DIAGNOSIS — I252 Old myocardial infarction: Secondary | ICD-10-CM | POA: Diagnosis not present

## 2019-07-27 DIAGNOSIS — I10 Essential (primary) hypertension: Secondary | ICD-10-CM | POA: Diagnosis not present

## 2019-07-27 DIAGNOSIS — C439 Malignant melanoma of skin, unspecified: Secondary | ICD-10-CM | POA: Diagnosis not present

## 2019-07-27 DIAGNOSIS — E785 Hyperlipidemia, unspecified: Secondary | ICD-10-CM | POA: Diagnosis not present

## 2019-07-27 DIAGNOSIS — I2510 Atherosclerotic heart disease of native coronary artery without angina pectoris: Secondary | ICD-10-CM | POA: Diagnosis not present

## 2019-07-28 DIAGNOSIS — E785 Hyperlipidemia, unspecified: Secondary | ICD-10-CM | POA: Diagnosis not present

## 2019-07-28 DIAGNOSIS — Z48815 Encounter for surgical aftercare following surgery on the digestive system: Secondary | ICD-10-CM | POA: Diagnosis not present

## 2019-07-28 DIAGNOSIS — I10 Essential (primary) hypertension: Secondary | ICD-10-CM | POA: Diagnosis not present

## 2019-07-28 DIAGNOSIS — I2510 Atherosclerotic heart disease of native coronary artery without angina pectoris: Secondary | ICD-10-CM | POA: Diagnosis not present

## 2019-07-28 DIAGNOSIS — C439 Malignant melanoma of skin, unspecified: Secondary | ICD-10-CM | POA: Diagnosis not present

## 2019-07-28 DIAGNOSIS — I252 Old myocardial infarction: Secondary | ICD-10-CM | POA: Diagnosis not present

## 2019-08-02 DIAGNOSIS — E785 Hyperlipidemia, unspecified: Secondary | ICD-10-CM | POA: Diagnosis not present

## 2019-08-02 DIAGNOSIS — I10 Essential (primary) hypertension: Secondary | ICD-10-CM | POA: Diagnosis not present

## 2019-08-02 DIAGNOSIS — C439 Malignant melanoma of skin, unspecified: Secondary | ICD-10-CM | POA: Diagnosis not present

## 2019-08-02 DIAGNOSIS — I252 Old myocardial infarction: Secondary | ICD-10-CM | POA: Diagnosis not present

## 2019-08-02 DIAGNOSIS — Z48815 Encounter for surgical aftercare following surgery on the digestive system: Secondary | ICD-10-CM | POA: Diagnosis not present

## 2019-08-02 DIAGNOSIS — I2510 Atherosclerotic heart disease of native coronary artery without angina pectoris: Secondary | ICD-10-CM | POA: Diagnosis not present

## 2019-08-03 DIAGNOSIS — C439 Malignant melanoma of skin, unspecified: Secondary | ICD-10-CM | POA: Diagnosis not present

## 2019-08-03 DIAGNOSIS — E785 Hyperlipidemia, unspecified: Secondary | ICD-10-CM | POA: Diagnosis not present

## 2019-08-03 DIAGNOSIS — I252 Old myocardial infarction: Secondary | ICD-10-CM | POA: Diagnosis not present

## 2019-08-03 DIAGNOSIS — I2510 Atherosclerotic heart disease of native coronary artery without angina pectoris: Secondary | ICD-10-CM | POA: Diagnosis not present

## 2019-08-03 DIAGNOSIS — I10 Essential (primary) hypertension: Secondary | ICD-10-CM | POA: Diagnosis not present

## 2019-08-03 DIAGNOSIS — Z48815 Encounter for surgical aftercare following surgery on the digestive system: Secondary | ICD-10-CM | POA: Diagnosis not present

## 2019-08-04 DIAGNOSIS — I10 Essential (primary) hypertension: Secondary | ICD-10-CM | POA: Diagnosis not present

## 2019-08-04 DIAGNOSIS — Z48815 Encounter for surgical aftercare following surgery on the digestive system: Secondary | ICD-10-CM | POA: Diagnosis not present

## 2019-08-04 DIAGNOSIS — C439 Malignant melanoma of skin, unspecified: Secondary | ICD-10-CM | POA: Diagnosis not present

## 2019-08-04 DIAGNOSIS — I252 Old myocardial infarction: Secondary | ICD-10-CM | POA: Diagnosis not present

## 2019-08-04 DIAGNOSIS — I2510 Atherosclerotic heart disease of native coronary artery without angina pectoris: Secondary | ICD-10-CM | POA: Diagnosis not present

## 2019-08-04 DIAGNOSIS — E785 Hyperlipidemia, unspecified: Secondary | ICD-10-CM | POA: Diagnosis not present

## 2019-08-07 DIAGNOSIS — Z48815 Encounter for surgical aftercare following surgery on the digestive system: Secondary | ICD-10-CM | POA: Diagnosis not present

## 2019-10-13 DIAGNOSIS — I252 Old myocardial infarction: Secondary | ICD-10-CM | POA: Diagnosis not present

## 2019-10-13 DIAGNOSIS — G629 Polyneuropathy, unspecified: Secondary | ICD-10-CM | POA: Diagnosis not present

## 2019-10-13 DIAGNOSIS — F330 Major depressive disorder, recurrent, mild: Secondary | ICD-10-CM | POA: Diagnosis not present

## 2019-10-13 DIAGNOSIS — M5416 Radiculopathy, lumbar region: Secondary | ICD-10-CM | POA: Diagnosis not present

## 2019-10-13 DIAGNOSIS — I2510 Atherosclerotic heart disease of native coronary artery without angina pectoris: Secondary | ICD-10-CM | POA: Diagnosis not present

## 2019-10-13 DIAGNOSIS — L989 Disorder of the skin and subcutaneous tissue, unspecified: Secondary | ICD-10-CM | POA: Diagnosis not present

## 2019-10-13 DIAGNOSIS — E785 Hyperlipidemia, unspecified: Secondary | ICD-10-CM | POA: Diagnosis not present

## 2019-10-13 DIAGNOSIS — N400 Enlarged prostate without lower urinary tract symptoms: Secondary | ICD-10-CM | POA: Diagnosis not present

## 2019-10-13 DIAGNOSIS — R350 Frequency of micturition: Secondary | ICD-10-CM | POA: Diagnosis not present

## 2019-10-13 DIAGNOSIS — I10 Essential (primary) hypertension: Secondary | ICD-10-CM | POA: Diagnosis not present

## 2019-10-26 DIAGNOSIS — D485 Neoplasm of uncertain behavior of skin: Secondary | ICD-10-CM | POA: Diagnosis not present

## 2019-10-26 DIAGNOSIS — L821 Other seborrheic keratosis: Secondary | ICD-10-CM | POA: Diagnosis not present

## 2019-10-26 DIAGNOSIS — L570 Actinic keratosis: Secondary | ICD-10-CM | POA: Diagnosis not present

## 2019-12-24 DIAGNOSIS — Z23 Encounter for immunization: Secondary | ICD-10-CM | POA: Diagnosis not present

## 2020-04-02 DIAGNOSIS — R110 Nausea: Secondary | ICD-10-CM | POA: Diagnosis not present

## 2020-04-02 DIAGNOSIS — R8290 Unspecified abnormal findings in urine: Secondary | ICD-10-CM | POA: Diagnosis not present

## 2020-04-02 DIAGNOSIS — R5383 Other fatigue: Secondary | ICD-10-CM | POA: Diagnosis not present

## 2020-04-12 ENCOUNTER — Ambulatory Visit
Admission: RE | Admit: 2020-04-12 | Discharge: 2020-04-12 | Disposition: A | Discharge status: Home / Self Care | Payer: Medicare Other | Source: Ambulatory Visit | Attending: Internal Medicine | Admitting: Internal Medicine

## 2020-04-12 ENCOUNTER — Other Ambulatory Visit: Payer: Self-pay | Admitting: Internal Medicine

## 2020-04-12 DIAGNOSIS — R630 Anorexia: Secondary | ICD-10-CM | POA: Diagnosis not present

## 2020-04-12 DIAGNOSIS — Z0000 Encounter for general adult medical examination without abnormal findings: Secondary | ICD-10-CM | POA: Diagnosis not present

## 2020-04-12 DIAGNOSIS — R5383 Other fatigue: Secondary | ICD-10-CM

## 2020-04-12 DIAGNOSIS — N400 Enlarged prostate without lower urinary tract symptoms: Secondary | ICD-10-CM | POA: Diagnosis not present

## 2020-04-12 DIAGNOSIS — R110 Nausea: Secondary | ICD-10-CM | POA: Diagnosis not present

## 2020-04-12 DIAGNOSIS — R000 Tachycardia, unspecified: Secondary | ICD-10-CM | POA: Diagnosis not present

## 2020-04-12 DIAGNOSIS — I252 Old myocardial infarction: Secondary | ICD-10-CM | POA: Diagnosis not present

## 2020-04-12 DIAGNOSIS — F330 Major depressive disorder, recurrent, mild: Secondary | ICD-10-CM | POA: Diagnosis not present

## 2020-04-12 DIAGNOSIS — G629 Polyneuropathy, unspecified: Secondary | ICD-10-CM | POA: Diagnosis not present

## 2020-04-12 DIAGNOSIS — Z79899 Other long term (current) drug therapy: Secondary | ICD-10-CM | POA: Diagnosis not present

## 2020-04-12 DIAGNOSIS — I2510 Atherosclerotic heart disease of native coronary artery without angina pectoris: Secondary | ICD-10-CM | POA: Diagnosis not present

## 2020-04-12 DIAGNOSIS — E785 Hyperlipidemia, unspecified: Secondary | ICD-10-CM | POA: Diagnosis not present

## 2020-07-08 IMAGING — NM NM HEPATO W/GB/PHARM/[PERSON_NAME]
2 series · 12 of 12 positions shown · non-contrast
Comparison: None.

CLINICAL DATA: Evaluate gallbladder. Acute cephalopathy. Multiple
gallstones within the gallbladder on CT.

EXAM:
NUCLEAR MEDICINE HEPATOBILIARY IMAGING
TECHNIQUE: Sequential images of the abdomen were obtained [DATE] minutes
following intravenous administration of radiopharmaceutical.
RADIOPHARMACEUTICALS:  5.4 mCi Vc-PPm  Choletec IV

[he hepatobiliary · 4.52mm/px · 6 of 60 frames shown (1 of 2)]
[frame 6/60]
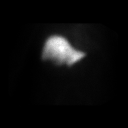
[frame 16/60]
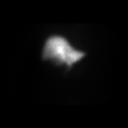
[frame 26/60]
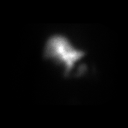
[frame 36/60]
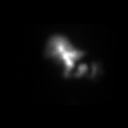
[frame 46/60]
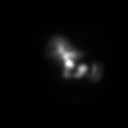
[frame 56/60]
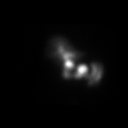

[he hepatobiliary · 4.52mm/px · 6 of 30 frames shown (2 of 2)]
[frame 3/30]
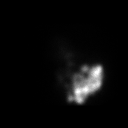
[frame 8/30]
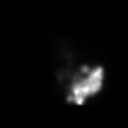
[frame 13/30]
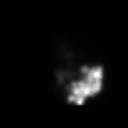
[frame 18/30]
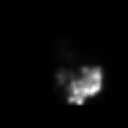
[frame 23/30]
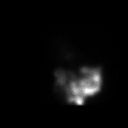
[frame 28/30]
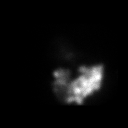

[12 of 12 positions shown; findings below may reference images not displayed]

FINDINGS: Prompt clearance radiotracer from the blood pool and homogeneous
uptake in liver. Counts are evident within the duodenum by 20
minutes. The gallbladder fails to fill over 2.5 hours. Morphine was
not administered due to patient's age.
IMPRESSION: Non filling of the gallbladder with differential including acute
cholecystitis versus chronic cholecystitis. The gallbladder is
collapsed and full of stones on comparison CT without inflammation
therefore favor chronic cholecystitis.

These results will be called to the ordering clinician or
representative by the Radiologist Assistant, and communication
documented in the PACS or [REDACTED].

## 2021-01-25 DIAGNOSIS — W19XXXA Unspecified fall, initial encounter: Secondary | ICD-10-CM | POA: Diagnosis not present

## 2021-01-25 DIAGNOSIS — R42 Dizziness and giddiness: Secondary | ICD-10-CM | POA: Diagnosis not present

## 2021-04-17 IMAGING — CR DG CHEST 2V
2 series · 2 of 2 positions shown · non-contrast
Comparison: 06/30/2019

CLINICAL DATA: Fatigue

EXAM:
CHEST - 2 VIEW

[w chest pa]
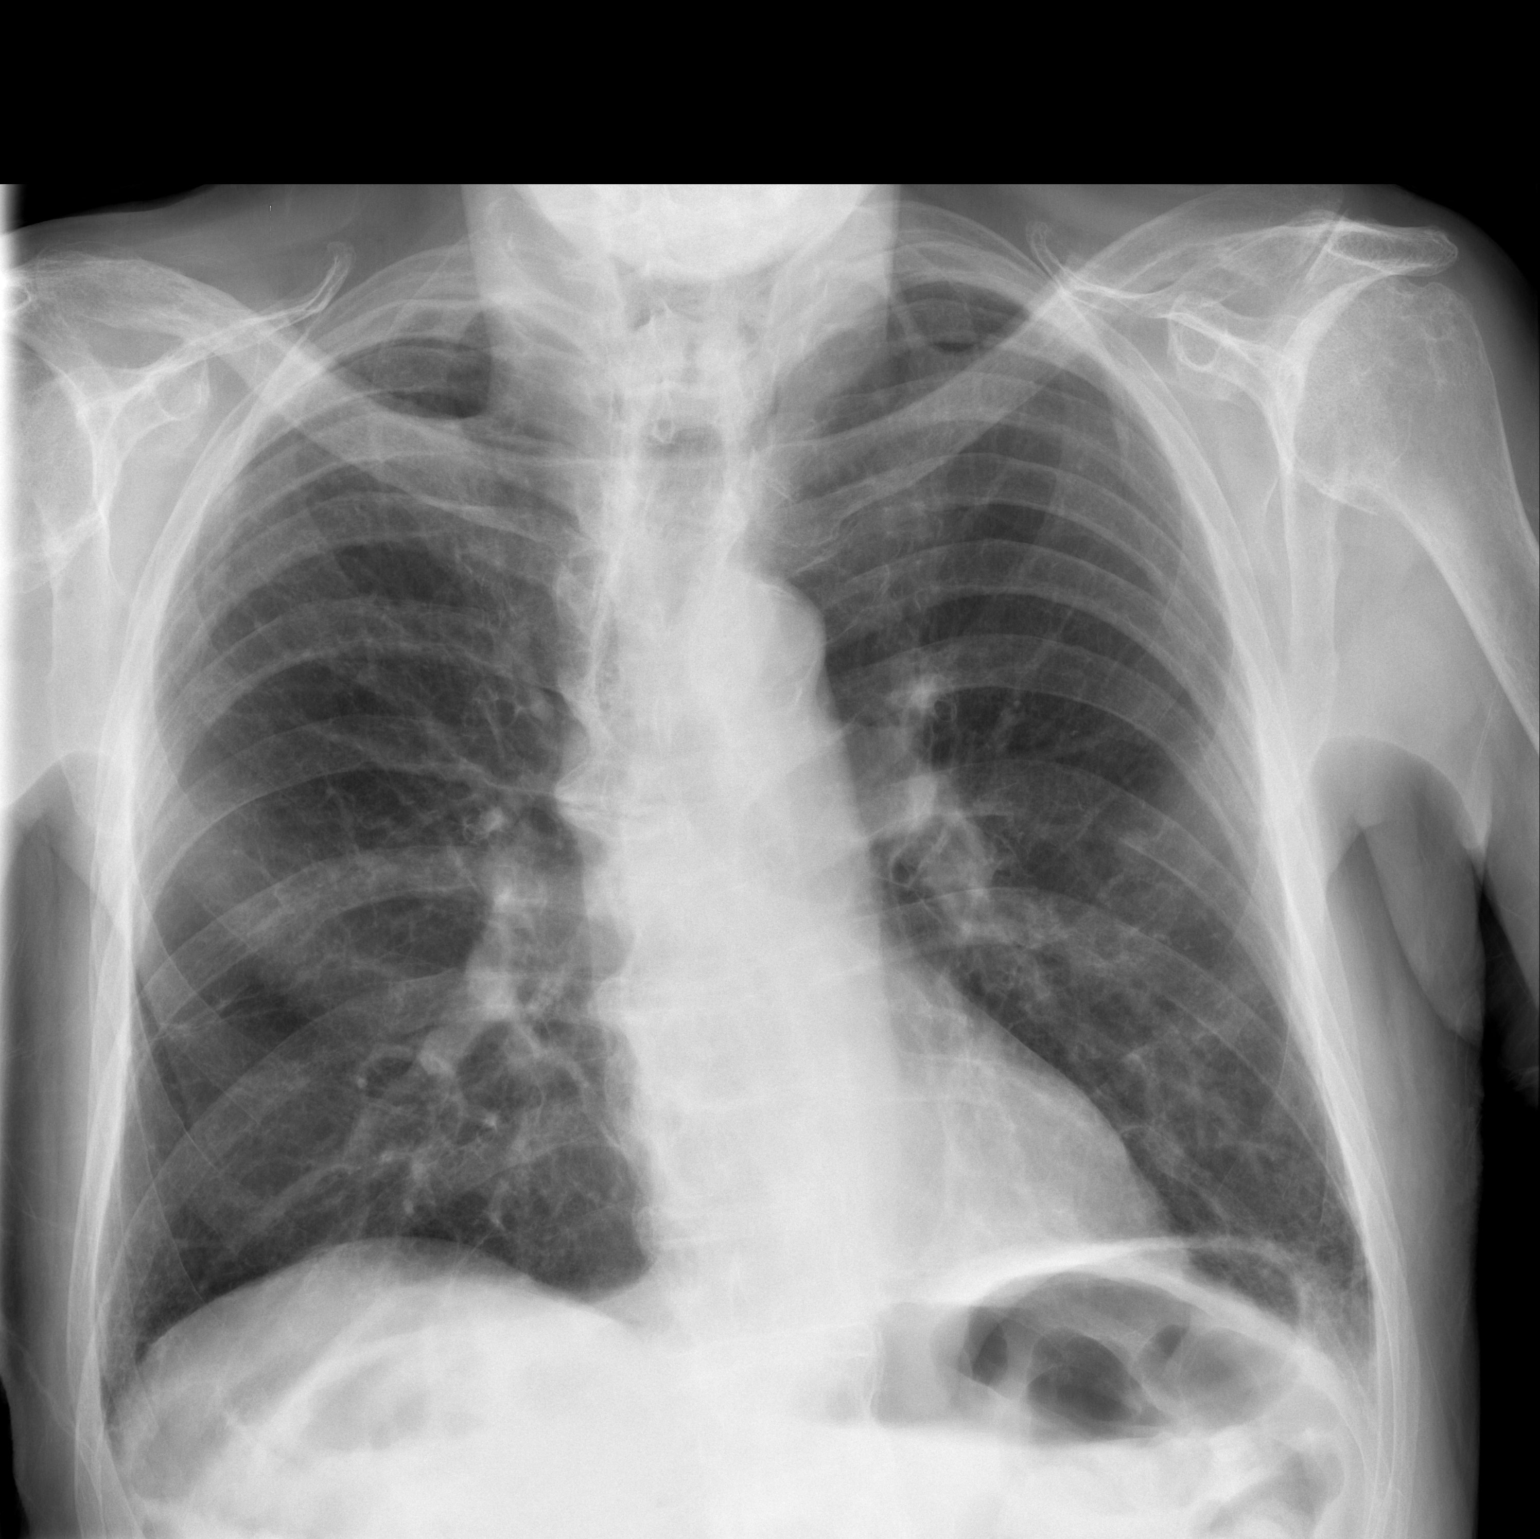

[w chest lat]
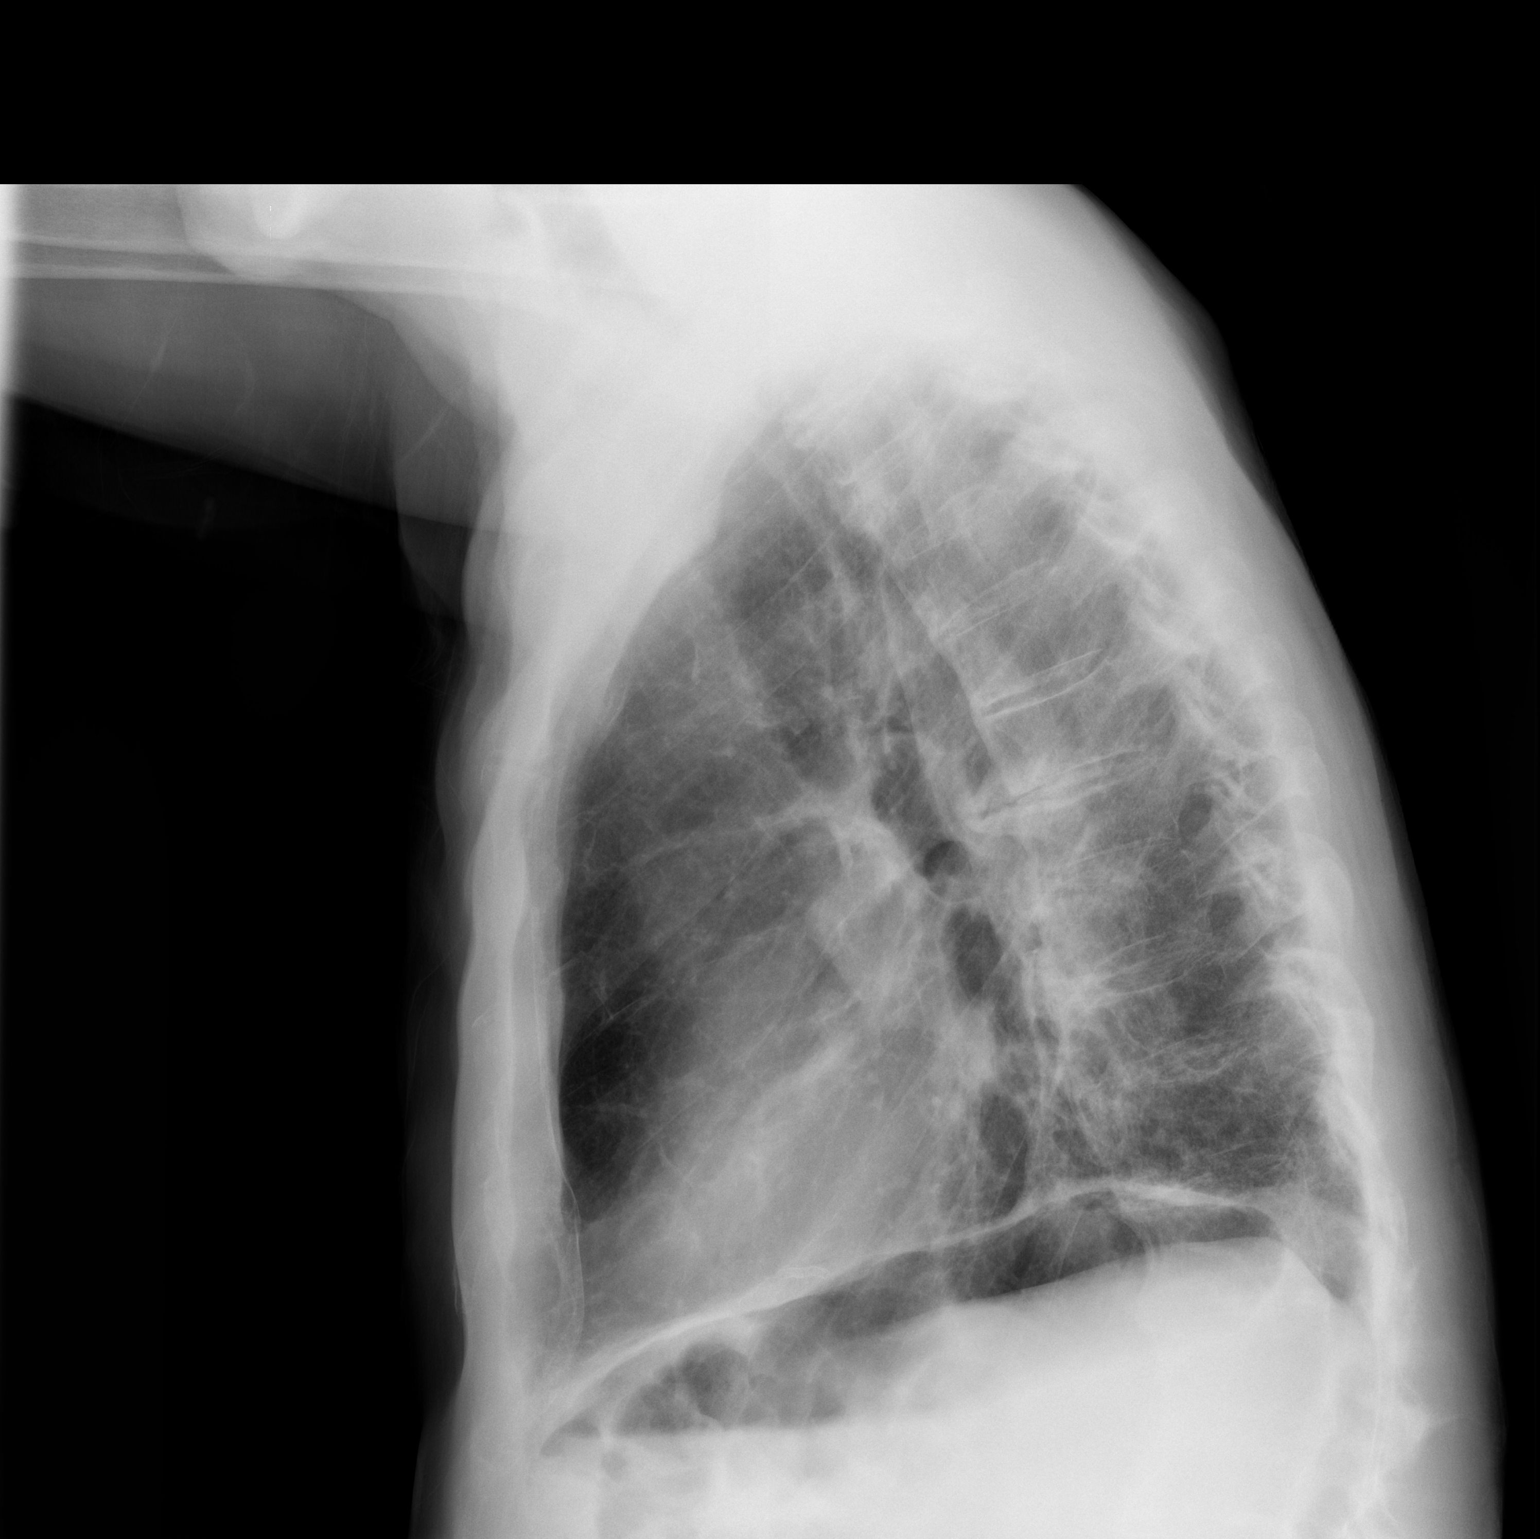

[2 of 2 positions shown; findings below may reference images not displayed]

FINDINGS: No focal consolidation or effusion. Minimal reticular opacity at the
CP angles likely due to fibrosis. Stable cardiomediastinal
silhouette with aortic atherosclerosis. No pneumothorax.
IMPRESSION: No active cardiopulmonary disease. Minimal fibrosis at the CP
angles.

## 2021-04-25 DIAGNOSIS — R404 Transient alteration of awareness: Secondary | ICD-10-CM | POA: Diagnosis not present

## 2021-04-25 DIAGNOSIS — R0902 Hypoxemia: Secondary | ICD-10-CM | POA: Diagnosis not present

## 2021-04-25 DIAGNOSIS — I469 Cardiac arrest, cause unspecified: Secondary | ICD-10-CM | POA: Diagnosis not present

## 2021-04-25 DIAGNOSIS — R4020 Unspecified coma: Secondary | ICD-10-CM | POA: Diagnosis not present

## 2021-05-22 DIAGNOSIS — 419620001 Death: Secondary | SNOMED CT | POA: Diagnosis not present

## 2021-05-22 DEATH — deceased
